# Patient Record
Sex: Male | Born: 1955
Health system: Southern US, Community
[De-identification: ages and names within clinical notes are randomized; demographics above are authoritative.]

## PROBLEM LIST (undated history)

## (undated) DIAGNOSIS — G4733 Obstructive sleep apnea (adult) (pediatric): Secondary | ICD-10-CM

## (undated) HISTORY — DX: Obstructive sleep apnea (adult) (pediatric): G47.33

---
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
## 2013-07-15 ENCOUNTER — Encounter: Payer: Self-pay | Admitting: Internal Medicine

## 2013-07-15 ENCOUNTER — Ambulatory Visit (INDEPENDENT_AMBULATORY_CARE_PROVIDER_SITE_OTHER): Payer: PRIVATE HEALTH INSURANCE | Admitting: Internal Medicine

## 2013-07-15 VITALS — BP 118/82 | HR 73 | Temp 97.5°F | Ht 74.0 in | Wt 269.4 lb

## 2013-07-15 DIAGNOSIS — E669 Obesity, unspecified: Secondary | ICD-10-CM

## 2013-07-15 DIAGNOSIS — Z1211 Encounter for screening for malignant neoplasm of colon: Secondary | ICD-10-CM

## 2013-07-15 DIAGNOSIS — Z0000 Encounter for general adult medical examination without abnormal findings: Secondary | ICD-10-CM

## 2013-07-15 DIAGNOSIS — G4733 Obstructive sleep apnea (adult) (pediatric): Secondary | ICD-10-CM | POA: Insufficient documentation

## 2013-07-15 MED ORDER — ZOLPIDEM TARTRATE 5 MG PO TABS: 5.0000 mg | ORAL_TABLET | Freq: Every evening | ORAL | Status: DC | PRN

## 2013-07-15 NOTE — Assessment & Plan Note (Signed)
Wt Readings from Last 3 Encounters:  07/15/13 269 lb 6.4 oz (122.199 kg)   Weight trends reviewed The patient is asked to make an attempt to improve diet and exercise patterns to aid in medical management of this problem.  Goal weight per patient 235-240lbs -encouragement provided

## 2013-07-15 NOTE — Progress Notes (Signed)
  Subjective:    Patient ID: Alex Rubio, male    DOB: 1956/11/12, 57 y.o.   MRN: [id number]  HPI  patient is here today for annual physical. Patient feels well and has no complaints.  Past Medical History  Diagnosis Date  . OSA (obstructive sleep apnea)     intol of CPAP trial (remote)   Family History  Problem Relation Age of Onset  . Leukemia Mother 67  . Diabetes Mellitus II Father    History  Substance Use Topics  . Smoking status: Never Smoker   . 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 Alcohol Use: No    Review of Systems Constitutional: Negative for fever or weight change.  Respiratory: Negative for cough and shortness of breath.   Cardiovascular: Negative for chest pain or palpitations.  Gastrointestinal: Negative for abdominal pain, no bowel changes.  Musculoskeletal: Negative for gait problem or joint swelling.  Skin: Negative for rash.  Neurological: Negative for dizziness or headache.  No other specific complaints in a complete review of systems (except as listed in HPI above).     Objective:   Physical Exam BP 118/82  Pulse 73  Temp(Src) 97.5 F (36.4 C) (Oral)  Ht 6\' 2"  (1.88 m)  Wt 269 lb 6.4 oz (122.199 kg)  BMI 34.57 kg/m2  SpO2 97% Wt Readings from Last 3 Encounters:  07/15/13 269 lb 6.4 oz (122.199 kg)   Constitutional: he appears well-developed and well-nourished. No distress.  HENT: Head: Normocephalic and atraumatic. Ears: B TMs ok, no erythema or effusion; Nose: Nose normal. Mouth/Throat: Oropharynx is clear and moist. No oropharyngeal exudate.  Eyes: Conjunctivae and EOM are normal. Pupils are equal, round, and reactive to light. No scleral icterus.  Neck: Normal range of motion. Neck supple. No JVD present. No thyromegaly present.  Cardiovascular: Normal rate, regular rhythm and normal heart sounds.  No murmur heard. No BLE edema. Pulmonary/Chest: Effort normal and breath sounds  normal. No respiratory distress. he has no wheezes.  Abdominal: Soft. Bowel sounds are normal. he exhibits no distension. There is no tenderness. no masses Musculoskeletal: Normal range of motion, no joint effusions. No gross deformities Neurological: he is alert and oriented to person, place, and time. No cranial nerve deficit. Coordination, balance, strength, speech and gait are normal.  Skin: Skin is warm and dry. No rash noted. No erythema.  Psychiatric: he has a normal mood and affect. behavior is normal. Judgment and thought content normal.  No results found for this basename: WBC,  HGB,  HCT,  PLT,  GLUCOSE,  CHOL,  TRIG,  HDL,  LDLDIRECT,  LDLCALC,  ALT,  AST,  NA,  K,  CL,  CREATININE,  BUN,  CO2,  TSH,  PSA,  INR,  GLUF,  HGBA1C,  MICROALBUR       Assessment & Plan:   CPX/v70.0 - Patient has been counseled on age-appropriate routine health concerns for screening and prevention. These are reviewed and up-to-date. Immunizations are up-to-date or declined. Labs ordered and reviewed.

## 2013-07-15 NOTE — Patient Instructions (Addendum)
It was good to see you today. We have reviewed your prior records including labs and tests today Health Maintenance reviewed - all recommended immunizations and age-appropriate screenings are up-to-date. Medications reviewed and updated, no changes recommended at this time. we'll make referral to sleep specialist for consultation about sleep apnea treatment options and to GI for colon screening (requested either Dr Leone Payor or Russella Dar). Our office will contact you regarding appointment(s) once made. Please schedule followup in 12 months for annual physical and labs, call sooner if problems.  Health Maintenance, Males A healthy lifestyle and preventative care can promote health and wellness.  Maintain regular health, dental, and eye exams.  Eat a healthy diet. Foods like vegetables, fruits, whole grains, low-fat dairy products, and lean protein foods contain the nutrients you need without too many calories. Decrease your intake of foods high in solid fats, added sugars, and salt. Get information about a proper diet from your caregiver, if necessary.  Regular physical exercise is one of the most important things you can do for your health. Most adults should get at least 150 minutes of moderate-intensity exercise (any activity that increases your heart rate and causes you to sweat) each week. In addition, most adults need muscle-strengthening exercises on 2 or more days a week.   Maintain a healthy weight. The body mass index (BMI) is a screening tool to identify possible weight problems. It provides an estimate of body fat based on height and weight. Your caregiver can help determine your BMI, and can help you achieve or maintain a healthy weight. For adults 20 years and older:  A BMI below 18.5 is considered underweight.  A BMI of 18.5 to 24.9 is normal.  A BMI of 25 to 29.9 is considered overweight.  A BMI of 30 and above is considered obese.  Maintain normal blood lipids and cholesterol by  exercising and minimizing your intake of saturated fat. Eat a balanced diet with plenty of fruits and vegetables. Blood tests for lipids and cholesterol should begin at age 44 and be repeated every 5 years. If your lipid or cholesterol levels are high, you are over 50, or you are a high risk for heart disease, you may need your cholesterol levels checked more frequently.Ongoing high lipid and cholesterol levels should be treated with medicines, if diet and exercise are not effective.  If you smoke, find out from your caregiver how to quit. If you do not use tobacco, do not start.  If you choose to drink alcohol, do not exceed 2 drinks per day. One drink is considered to be 12 ounces (355 mL) of beer, 5 ounces (148 mL) of wine, or 1.5 ounces (44 mL) of liquor.  Avoid use of street drugs. Do not share needles with anyone. Ask for help if you need support or instructions about stopping the use of drugs.  High blood pressure causes heart disease and increases the risk of stroke. Blood pressure should be checked at least every 1 to 2 years. Ongoing high blood pressure should be treated with medicines if weight loss and exercise are not effective.  If you are 56 to 56 years old, ask your caregiver if you should take aspirin to prevent heart disease.  Diabetes screening involves taking a blood sample to check your fasting blood sugar level. This should be done once every 3 years, after age 59, if you are within normal weight and without risk factors for diabetes. Testing should be considered at a younger age or be  carried out more frequently if you are overweight and have at least 1 risk factor for diabetes.  Colorectal cancer can be detected and often prevented. Most routine colorectal cancer screening begins at the age of 110 and continues through age 14. However, your caregiver may recommend screening at an earlier age if you have risk factors for colon cancer. On a yearly basis, your caregiver may provide  home test kits to check for hidden blood in the stool. Use of a small camera at the end of a tube, to directly examine the colon (sigmoidoscopy or colonoscopy), can detect the earliest forms of colorectal cancer. Talk to your caregiver about this at age 13, when routine screening begins. Direct examination of the colon should be repeated every 5 to 10 years through age 57, unless early forms of pre-cancerous polyps or small growths are found.  Hepatitis C blood testing is recommended for all people born from [birth year] through 1965 and any individual with known risks for hepatitis C.  Healthy men should no longer receive prostate-specific antigen (PSA) blood tests as part of routine cancer screening. Consult with your caregiver about prostate cancer screening.  Testicular cancer screening is not recommended for adolescents or adult males who have no symptoms. Screening includes self-exam, caregiver exam, and other screening tests. Consult with your caregiver about any symptoms you have or any concerns you have about testicular cancer.  Practice safe sex. Use condoms and avoid high-risk sexual practices to reduce the spread of sexually transmitted infections (STIs).  Use sunscreen with a sun protection factor (SPF) of 30 or greater. Apply sunscreen liberally and repeatedly throughout the day. You should seek shade when your shadow is shorter than you. Protect yourself by wearing long sleeves, pants, a wide-brimmed hat, and sunglasses year round, whenever you are outdoors.  Notify your caregiver of new moles or changes in moles, especially if there is a change in shape or color. Also notify your caregiver if a mole is larger than the size of a pencil eraser.  A one-time screening for abdominal aortic aneurysm (AAA) and surgical repair of large AAAs by sound wave imaging (ultrasonography) is recommended for ages 61 to 40 years who are current or former smokers.  Stay current with your immunizations. Document  Released: 06/09/2008 Document Revised: 03/05/2012 Document Reviewed: 05/09/2011 [facility] Patient Information 2014 [place], [place]. Exercise to Lose Weight Exercise and a healthy diet may help you lose weight. Your doctor may suggest specific exercises. EXERCISE IDEAS AND TIPS  Choose low-cost things you enjoy doing, such as walking, bicycling, or exercising to workout videos.  Take stairs instead of the elevator.  Walk during your lunch break.  Park your car further away from work or school.  Go to a gym or an exercise class.  Start with 5 to 10 minutes of exercise each day. Build up to 30 minutes of exercise 4 to 6 days a week.  Wear shoes with good support and comfortable clothes.  Stretch before and after working out.  Work out until you breathe harder and your heart beats faster.  Drink extra water when you exercise.  Do not do so much that you hurt yourself, feel dizzy, or get very short of breath. Exercises that burn about 150 calories:  Running 1  miles in 15 minutes.  Playing volleyball for 45 to 60 minutes.  Washing and waxing a car for 45 to 60 minutes.  Playing touch football for 45 minutes.  Walking 1  miles in 35 minutes.  Pushing a stroller 1  miles in 30 minutes.  Playing basketball for 30 minutes.  Raking leaves for 30 minutes.  Bicycling 5 miles in 30 minutes.  Walking 2 miles in 30 minutes.  Dancing for 30 minutes.  Shoveling snow for 15 minutes.  Swimming laps for 20 minutes.  Walking up stairs for 15 minutes.  Bicycling 4 miles in 15 minutes.  Gardening for 30 to 45 minutes.  Jumping rope for 15 minutes.  Washing windows or floors for 45 to 60 minutes. Document Released: 01/14/2011 Document Revised: 03/05/2012 Document Reviewed: 01/14/2011 [facility] Patient Information 2014 [place], [place].

## 2013-07-15 NOTE — Assessment & Plan Note (Signed)
Reports prior sleep study done approximately 2005, but intolerant of CPAP trial at the time. Weight gain reviewed, patient understands the relationship between weight and disease Patient agrees to followup sleep consultation regarding potential options for treatment of sleep apnea, we'll make referral now

## 2013-08-30 ENCOUNTER — Ambulatory Visit (INDEPENDENT_AMBULATORY_CARE_PROVIDER_SITE_OTHER): Payer: PRIVATE HEALTH INSURANCE | Admitting: Pulmonary Disease

## 2013-08-30 ENCOUNTER — Encounter: Payer: Self-pay | Admitting: Pulmonary Disease

## 2013-08-30 VITALS — BP 128/62 | HR 90 | Temp 98.2°F | Ht 74.0 in | Wt 262.0 lb

## 2013-08-30 DIAGNOSIS — G4733 Obstructive sleep apnea (adult) (pediatric): Secondary | ICD-10-CM

## 2013-08-30 NOTE — Progress Notes (Signed)
Chief Complaint  Patient presents with  . Sleep Consult    referred by Dr. Felicity Coyer for OSA. Epworth Score: 6.    History of Present Illness: Alex Rubio is a 57 y.o. male for evaluation of sleep problems.  He has a sleep study in 2007.  He was found to have sleep apnea.  He was tried on CPAP, but didn't like the mask.  He tried full face and nasal masks.  He was not sure if CPAP was helping him, so he stopped using.  He has gained about 20 lbs since 2007.  His wife is concerned that his snoring has gotten worse, and the his sleep apnea may be worse also.  He goes to sleep at 10 pm.  He falls asleep in 30 minutes to an hour.  He wakes up 2 to 3 times to use the bathroom.  He gets out of bed at 5 am.  He feels tired in the morning.  He denies morning headache.  He occasionally uses ambien to help him fall sleep.  He drinks 2 cups of coffee in the morning, and another cup in the afternoon.  He will occasionally fall asleep on the couch.  He denies sleep walking, sleep talking, bruxism, or nightmares.  There is no history of restless legs.  He denies sleep hallucinations, sleep paralysis, or cataplexy.  The Epworth score is 6 out of 24.  Tests:   Alex Rubio  has a past medical history of OSA (obstructive sleep apnea).  Alex Rubio  has past surgical history that includes Shoulder surgery (1977).  Prior to Admission medications   Medication Sig Start Date End Date Taking? Authorizing Provider  zolpidem (AMBIEN) 5 MG tablet Take 1 tablet (5 mg total) by mouth at bedtime as needed for sleep. 07/15/13 08/30/14 Yes Newt Lukes, MD    No Known Allergies  His family history includes Diabetes Mellitus II in his father; Leukemia (age of onset: 23) in his mother.  He  reports that he has never smoked. He does not have any smokeless tobacco history on file. He reports that he does not drink alcohol or use illicit drugs.  Review of Systems  Constitutional: Negative for fever, chills,  diaphoresis, activity change, appetite change, fatigue and unexpected weight change.  HENT: Negative for hearing loss, ear pain, nosebleeds, congestion, sore throat, facial swelling, rhinorrhea, sneezing, mouth sores, trouble swallowing, neck pain, neck stiffness, dental problem, voice change, postnasal drip, sinus pressure, tinnitus and ear discharge.   Eyes: Negative for photophobia, discharge, itching and visual disturbance.  Respiratory: Negative for apnea, cough, choking, chest tightness, shortness of breath, wheezing and stridor.   Cardiovascular: Negative for chest pain, palpitations and leg swelling.  Gastrointestinal: Negative for nausea, vomiting, abdominal pain, constipation, blood in stool and abdominal distention.  Genitourinary: Negative for dysuria, urgency, frequency, hematuria, flank pain, decreased urine volume and difficulty urinating.  Musculoskeletal: Negative for myalgias, back pain, joint swelling, arthralgias and gait problem.  Skin: Negative for color change, pallor and rash.  Neurological: Negative for dizziness, tremors, seizures, syncope, speech difficulty, weakness, light-headedness, numbness and headaches.  Hematological: Negative for adenopathy. Does not bruise/bleed easily.  Psychiatric/Behavioral: Positive for sleep disturbance. Negative for confusion and agitation. The patient is not nervous/anxious.    Physical Exam:  General - No distress ENT - No sinus tenderness, no oral exudate, MP 3, no LAN, no thyromegaly, TM clear, pupils equal/reactive Cardiac - s1s2 regular, no murmur, pulses symmetric Chest - No wheeze/rales/dullness, good air entry,  normal respiratory excursion Back - No focal tenderness Abd - Soft, non-tender, no organomegaly, + bowel sounds Ext - No edema Neuro - Normal strength, cranial nerves intact Skin - No rashes Psych - Normal mood, and behavior

## 2013-08-30 NOTE — Patient Instructions (Signed)
Will arrange for sleep study Will call to arrange for follow up after sleep study reviewed 

## 2013-08-30 NOTE — Progress Notes (Deleted)
  Subjective:    Patient ID: Alex Rubio, male    DOB: 23-Jun-1956, 57 y.o.   MRN: [id number]  HPI    Review of Systems  Constitutional: Negative for fever, chills, diaphoresis, activity change, appetite change, fatigue and unexpected weight change.  HENT: Negative for hearing loss, ear pain, nosebleeds, congestion, sore throat, facial swelling, rhinorrhea, sneezing, mouth sores, trouble swallowing, neck pain, neck stiffness, dental problem, voice change, postnasal drip, sinus pressure, tinnitus and ear discharge.   Eyes: Negative for photophobia, discharge, itching and visual disturbance.  Respiratory: Negative for apnea, cough, choking, chest tightness, shortness of breath, wheezing and stridor.   Cardiovascular: Negative for chest pain, palpitations and leg swelling.  Gastrointestinal: Negative for nausea, vomiting, abdominal pain, constipation, blood in stool and abdominal distention.  Genitourinary: Negative for dysuria, urgency, frequency, hematuria, flank pain, decreased urine volume and difficulty urinating.  Musculoskeletal: Negative for myalgias, back pain, joint swelling, arthralgias and gait problem.  Skin: Negative for color change, pallor and rash.  Neurological: Negative for dizziness, tremors, seizures, syncope, speech difficulty, weakness, light-headedness, numbness and headaches.  Hematological: Negative for adenopathy. Does not bruise/bleed easily.  Psychiatric/Behavioral: Positive for sleep disturbance. Negative for confusion and agitation. The patient is not nervous/anxious.        Objective:   Physical Exam        Assessment & Plan:

## 2013-09-02 NOTE — Assessment & Plan Note (Signed)
Has prior history of sleep apnea.  He reports continued snoring, sleep disruption, and daytime sleepiness.  He has gained weight since original sleep study.  I am concerned he still has sleep apnea.  We discussed how sleep apnea can affect various health problems including risks for hypertension, cardiovascular disease, and diabetes.  We also discussed how sleep disruption can increase risks for accident, such as while driving.  Weight loss as a means of improving sleep apnea was also reviewed.  Additional treatment options discussed were CPAP therapy, oral appliance, and surgical intervention.  Will arrange for in lab sleep study to further assess.

## 2013-09-04 ENCOUNTER — Other Ambulatory Visit (INDEPENDENT_AMBULATORY_CARE_PROVIDER_SITE_OTHER): Payer: PRIVATE HEALTH INSURANCE

## 2013-09-04 ENCOUNTER — Encounter: Payer: Self-pay | Admitting: Internal Medicine

## 2013-09-04 DIAGNOSIS — E785 Hyperlipidemia, unspecified: Secondary | ICD-10-CM | POA: Insufficient documentation

## 2013-09-04 DIAGNOSIS — Z0000 Encounter for general adult medical examination without abnormal findings: Secondary | ICD-10-CM

## 2013-09-04 DIAGNOSIS — E669 Obesity, unspecified: Secondary | ICD-10-CM

## 2013-09-04 LAB — CBC WITH DIFFERENTIAL/PLATELET
Basophils Absolute: 0 10*3/uL (ref 0.0–0.1)
Basophils Relative: 0.3 % (ref 0.0–3.0)
Eosinophils Absolute: 0.1 10*3/uL (ref 0.0–0.7)
Eosinophils Relative: 1.5 % (ref 0.0–5.0)
HCT: 41.4 % (ref 39.0–52.0)
Hemoglobin: 14.3 g/dL (ref 13.0–17.0)
Lymphocytes Relative: 29.1 % (ref 12.0–46.0)
Lymphs Abs: 1.9 10*3/uL (ref 0.7–4.0)
MCHC: 34.5 g/dL (ref 30.0–36.0)
MCV: 83.3 fl (ref 78.0–100.0)
Monocytes Absolute: 0.5 10*3/uL (ref 0.1–1.0)
Monocytes Relative: 7.2 % (ref 3.0–12.0)
Neutro Abs: 4.1 10*3/uL (ref 1.4–7.7)
Neutrophils Relative %: 61.9 % (ref 43.0–77.0)
Platelets: 234 10*3/uL (ref 150.0–400.0)
RBC: 4.97 Mil/uL (ref 4.22–5.81)
RDW: 14.2 % (ref 11.5–14.6)
WBC: 6.7 10*3/uL (ref 4.5–10.5)

## 2013-09-04 LAB — URINALYSIS, ROUTINE W REFLEX MICROSCOPIC
Bilirubin Urine: NEGATIVE
Hgb urine dipstick: NEGATIVE
Ketones, ur: NEGATIVE
Leukocytes, UA: NEGATIVE
Nitrite: NEGATIVE
RBC / HPF: NONE SEEN (ref 0–?)
Specific Gravity, Urine: 1.025 (ref 1.000–1.030)
Total Protein, Urine: NEGATIVE
Urine Glucose: NEGATIVE
Urobilinogen, UA: 0.2 (ref 0.0–1.0)
pH: 5.5 (ref 5.0–8.0)

## 2013-09-04 LAB — LIPID PANEL
Cholesterol: 223 mg/dL — ABNORMAL HIGH (ref 0–200)
HDL: 43.9 mg/dL (ref >39.00)
Total CHOL/HDL Ratio: 5
Triglycerides: 139 mg/dL (ref 0.0–149.0)
VLDL: 27.8 mg/dL (ref 0.0–40.0)

## 2013-09-04 LAB — BASIC METABOLIC PANEL
BUN: 13 mg/dL (ref 6–23)
CO2: 28 mEq/L (ref 19–32)
Calcium: 9.2 mg/dL (ref 8.4–10.5)
Chloride: 104 mEq/L (ref 96–112)
Creatinine, Ser: 0.9 mg/dL (ref 0.4–1.5)
GFR: 91.59 mL/min (ref >60.00)
Glucose, Bld: 84 mg/dL (ref 70–99)
Potassium: 4.6 mEq/L (ref 3.5–5.1)
Sodium: 137 mEq/L (ref 135–145)

## 2013-09-04 LAB — LDL CHOLESTEROL, DIRECT: Direct LDL: 144.4 mg/dL

## 2013-09-04 LAB — HEPATIC FUNCTION PANEL
ALT: 17 U/L (ref 0–53)
AST: 13 U/L (ref 0–37)
Albumin: 4.1 g/dL (ref 3.5–5.2)
Alkaline Phosphatase: 54 U/L (ref 39–117)
Bilirubin, Direct: 0.1 mg/dL (ref 0.0–0.3)
Total Bilirubin: 0.8 mg/dL (ref 0.3–1.2)
Total Protein: 7.1 g/dL (ref 6.0–8.3)

## 2013-09-04 LAB — PSA: PSA: 0.73 ng/mL (ref 0.10–4.00)

## 2013-09-04 LAB — TSH: TSH: 1.74 u[IU]/mL (ref 0.35–5.50)

## 2013-09-19 ENCOUNTER — Encounter: Payer: Self-pay | Admitting: Gastroenterology

## 2016-08-26 ENCOUNTER — Ambulatory Visit (INDEPENDENT_AMBULATORY_CARE_PROVIDER_SITE_OTHER): Payer: No Typology Code available for payment source

## 2016-08-26 ENCOUNTER — Ambulatory Visit (INDEPENDENT_AMBULATORY_CARE_PROVIDER_SITE_OTHER): Payer: No Typology Code available for payment source | Admitting: Physician Assistant

## 2016-08-26 VITALS — BP 122/80 | HR 67 | Temp 97.9°F | Resp 16 | Ht 74.0 in | Wt 262.0 lb

## 2016-08-26 DIAGNOSIS — R05 Cough: Secondary | ICD-10-CM

## 2016-08-26 DIAGNOSIS — R0981 Nasal congestion: Secondary | ICD-10-CM | POA: Diagnosis not present

## 2016-08-26 DIAGNOSIS — R059 Cough, unspecified: Secondary | ICD-10-CM

## 2016-08-26 LAB — POCT CBC
Granulocyte percent: 70.9 %G (ref 37–80)
HCT, POC: 37.8 % — AB (ref 43.5–53.7)
Hemoglobin: 13 g/dL — AB (ref 14.1–18.1)
Lymph, poc: 2.1 (ref 0.6–3.4)
MCH, POC: 28.6 pg (ref 27–31.2)
MCHC: 34.4 g/dL (ref 31.8–35.4)
MCV: 83.2 fL (ref 80–97)
MID (cbc): 0.8 (ref 0–0.9)
MPV: 7.5 fL (ref 0–99.8)
POC Granulocyte: 7 — AB (ref 2–6.9)
POC LYMPH PERCENT: 21.4 %L (ref 10–50)
POC MID %: 7.7 %M (ref 0–12)
Platelet Count, POC: 279 10*3/uL (ref 142–424)
RBC: 4.54 M/uL — AB (ref 4.69–6.13)
RDW, POC: 13.5 %
WBC: 9.9 10*3/uL (ref 4.6–10.2)

## 2016-08-26 MED ORDER — AZITHROMYCIN 500 MG PO TABS: 500.0000 mg | ORAL_TABLET | Freq: Every day | ORAL | 0 refills | Status: DC

## 2016-08-26 NOTE — Patient Instructions (Signed)
     IF you received an x-ray today, you will receive an invoice from [facility]. Please contact [facility] at [phone] with questions or concerns regarding your invoice.   IF you received labwork today, you will receive an invoice from [facility]. Please contact [facility] at [phone] with questions or concerns regarding your invoice.   Our billing staff will not be able to assist you with questions regarding bills from these companies.  You will be contacted with the lab results as soon as they are available. The fastest way to get your results is to activate your My Chart account. Instructions are located on the last page of this paperwork. If you have not heard from us regarding the results in 2 weeks, please contact this office.      

## 2016-08-26 NOTE — Progress Notes (Signed)
08/26/2016 10:57 AM   DOB: Jun 14, 1956 / MRN: [id number]  SUBJECTIVE:  Alex Rubio is a 60 y.o. male presenting for cough and nasal congestion that started about 8 days ago.  Felt he was improving at day five however suddenly became worse on day 7 of illness.  Denies fever, chills, nausea.  Has some teeth pain but only when coughing.  Never smoker, no history of asthma.   He has No Known Allergies.   He  has a past medical history of OSA (obstructive sleep apnea).    He  reports that he has never smoked. He does not have any smokeless tobacco history on file. He reports that he does not drink alcohol or use drugs. He  has no sexual activity history on file. The patient  has a past surgical history that includes Shoulder surgery (1977).  His family history includes Diabetes Mellitus II in his father; Leukemia (age of onset: 4168) in his mother.  Review of Systems  Constitutional: Negative for chills and fever.  HENT: Negative for ear pain.   Respiratory: Negative for hemoptysis, shortness of breath and wheezing.   Cardiovascular: Negative for chest pain and leg swelling.  Musculoskeletal: Negative for myalgias.  Skin: Negative for itching and rash.  Neurological: Negative for dizziness and headaches.    The problem list and medications were reviewed and updated by myself where necessary and exist elsewhere in the encounter.   OBJECTIVE:  BP 122/80   Pulse 67   Temp 97.9 F (36.6 C) (Oral)   Resp 16   Ht 6\' 2"  (1.88 m)   Wt 262 lb (118.8 kg)   SpO2 98%   BMI 33.64 kg/m   Physical Exam  Constitutional: He is oriented to person, place, and time. He appears well-developed and well-nourished. No distress.  HENT:  Right Ear: Tympanic membrane normal.  Left Ear: Tympanic membrane normal.  Nose: Nose normal.  Mouth/Throat: Uvula is midline, oropharynx is clear and moist and mucous membranes are normal.  Cardiovascular: Normal rate, regular rhythm, normal heart sounds and intact  distal pulses.   Pulmonary/Chest: Effort normal. No respiratory distress. He has no wheezes. He has rales (crackles at right lung base). He exhibits no tenderness.  Abdominal: Soft. Bowel sounds are normal. He exhibits no distension.  Neurological: He is alert and oriented to person, place, and time. He has normal reflexes. He displays normal reflexes. No cranial nerve deficit. He exhibits normal muscle tone. Coordination normal.  Skin: Skin is warm and dry. He is not diaphoretic.  Psychiatric: He has a normal mood and affect.    Results for orders placed or performed in visit on 08/26/16 (from the past 72 hour(s))  POCT CBC     Status: Abnormal   Collection Time: 08/26/16 10:42 AM  Result Value Ref Range   WBC 9.9 4.6 - 10.2 K/uL   Lymph, poc 2.1 0.6 - 3.4   POC LYMPH PERCENT 21.4 10 - 50 %L   MID (cbc) 0.8 0 - 0.9   POC MID % 7.7 0 - 12 %M   POC Granulocyte 7.0 (A) 2 - 6.9   Granulocyte percent 70.9 37 - 80 %G   RBC 4.54 (A) 4.69 - 6.13 M/uL   Hemoglobin 13.0 (A) 14.1 - 18.1 g/dL   HCT, POC 78.237.8 (A) 95.643.5 - 53.7 %   MCV 83.2 80 - 97 fL   MCH, POC 28.6 27 - 31.2 pg   MCHC 34.4 31.8 - 35.4 g/dL   RDW,  POC 13.5 %   Platelet Count, POC 279 142 - 424 K/uL   MPV 7.5 0 - 99.8 fL    Dg Chest 2 View  Result Date: 08/26/2016 CLINICAL DATA:  Cough and nasal congestion that started about 8 days ago. EXAM: CHEST  2 VIEW COMPARISON:  None. FINDINGS: The lungs are clear wiithout focal pneumonia, edema, pneumothorax or pleural effusion. The cardiopericardial silhouette is within normal limits for size. The visualized bony structures of the thorax are intact. IMPRESSION: No active cardiopulmonary disease. Electronically Signed   By: Kennith Center M.D.   On: 08/26/2016 10:54    ASSESSMENT AND PLAN  Alex Rubio was seen today for nasal congestion and cough.  Diagnoses and all orders for this visit:  Cough: He has crackles at the right lung base and CBC shows an early shift.  This may all be viral  however could easily be an early pneumonia.  Advised we cover for common organisms with Azithromycin.  -     DG Chest 2 View; Future -     POCT CBC  Nasal congestion: See problem one.     The patient is advised to call or return to clinic if he does not see an improvement in symptoms, or to seek the care of the closest emergency department if he worsens with the above plan.   Deliah Boston, MHS, PA-C [facility] 08/26/2016 10:57 AM

## 2016-10-11 ENCOUNTER — Ambulatory Visit (INDEPENDENT_AMBULATORY_CARE_PROVIDER_SITE_OTHER): Payer: No Typology Code available for payment source | Admitting: Internal Medicine

## 2016-10-11 ENCOUNTER — Encounter: Payer: Self-pay | Admitting: Internal Medicine

## 2016-10-11 VITALS — BP 118/84 | HR 73 | Temp 97.9°F | Resp 16 | Ht 74.0 in | Wt 260.0 lb

## 2016-10-11 DIAGNOSIS — E6609 Other obesity due to excess calories: Secondary | ICD-10-CM

## 2016-10-11 DIAGNOSIS — Z0000 Encounter for general adult medical examination without abnormal findings: Secondary | ICD-10-CM | POA: Diagnosis not present

## 2016-10-11 DIAGNOSIS — Z6833 Body mass index (BMI) 33.0-33.9, adult: Secondary | ICD-10-CM

## 2016-10-11 NOTE — Progress Notes (Signed)
Subjective:    Patient ID: Alex KiefGregory Rubio, male    DOB: [birth year], 60 y.o.   MRN: [id number]  HPI He is here for a physical exam.   He denies any changes in his health since he was here last.  He has no concerns or questions.    Medications and allergies reviewed with patient and updated if appropriate.  Patient Active Problem List   Diagnosis Date Noted  . Dyslipidemia   . Obese 07/15/2013  . OSA (obstructive sleep apnea)     No current outpatient prescriptions on file prior to visit.   No current facility-administered medications on file prior to visit.     Past Medical History:  Diagnosis Date  . OSA (obstructive sleep apnea)    intol of CPAP trial (remote)    Past Surgical History:  Procedure Laterality Date  . SHOULDER SURGERY  1977    Social History   Social History  . Marital status: Married    Spouse name: N/A  . Number of children: N/A  . Years of education: N/A   Social History Main Topics  . Smoking status: Never Smoker  . 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 Alcohol use No  . Drug use: No  . Sexual activity: Not Asked   Other Topics Concern  . None   Social History Narrative   Exercise: walking regularly    Family History  Problem Relation Age of Onset  . Leukemia Mother 6468  . Diabetes Mellitus II Father     Review of Systems  Constitutional: Negative for appetite change, chills, fatigue, fever and unexpected weight change.  HENT: Positive for hearing loss (mild in right).   Eyes: Negative for visual disturbance.  Respiratory: Negative for cough, shortness of breath and wheezing.   Cardiovascular: Negative for chest pain, palpitations and leg swelling.  Gastrointestinal: Negative for abdominal pain, blood in stool, constipation, diarrhea and nausea.       No gerd  Genitourinary: Negative for difficulty urinating, dysuria and hematuria.  Musculoskeletal: Positive for  arthralgias (right shoulder pain). Negative for back pain.  Skin: Negative for color change and rash.  Neurological: Negative for dizziness, light-headedness and headaches.  Psychiatric/Behavioral: Negative for dysphoric mood. The patient is not nervous/anxious.        Objective:   Vitals:   10/11/16 1421  BP: 118/84  Pulse: 73  Resp: 16  Temp: 97.9 F (36.6 C)   Filed Weights   10/11/16 1421  Weight: 260 lb (117.9 kg)   Body mass index is 33.38 kg/m.   Physical Exam Constitutional: He appears well-developed and well-nourished. No distress.  HENT:  Head: Normocephalic and atraumatic.  Right Ear: External ear normal.  Left Ear: External ear normal.  Mouth/Throat: Oropharynx is clear and moist.  Normal ear canals and TM b/l  Eyes: Conjunctivae and EOM are normal.  Neck: Neck supple. No tracheal deviation present. No thyromegaly present.  No carotid bruit  Cardiovascular: Normal rate, regular rhythm, normal heart sounds and intact distal pulses.   No murmur heard. Pulmonary/Chest: Effort normal and breath sounds normal. No respiratory distress. He has no wheezes. He has no rales.  Abdominal: Soft. Bowel sounds are normal. He exhibits no distension. There is no tenderness.  Genitourinary:  deferred by patient - will check psa Musculoskeletal: He exhibits no edema.  Lymphadenopathy:   He has no cervical adenopathy.  Skin: Skin is warm and dry. He  is not diaphoretic.  Psychiatric: He has a normal mood and affect. His behavior is normal.       Assessment & Plan:    Physical exam: Screening blood work ordered Immunizations  deferred Colonoscopy - deferred, but is willing to do cologuard - advised him to call his insurance co Eye exams - not up to date - advised him to schedule Exercise - regular Weight - working on weight loss Skin  - has had a skin exam , has seen derm Substance abuse - none  See Problem List for Assessment and Plan of chronic medical  problems.

## 2016-10-11 NOTE — Assessment & Plan Note (Signed)
Exercising Will work on weight loss

## 2016-10-11 NOTE — Patient Instructions (Addendum)
Call your insurance company and see if cologuard is covered.  If it is let us know.    Test(s) ordered today. Your results will be released to MyChart (or called to you) after review, usually within 72hours after test completion. If any changes need to be made, you will be notified at that same time.  All other Health Maintenance issues reviewed.   All recommended immunizations and age-appropriate screenings are up-to-date or discussed.  No immunizations administered today.   Medications reviewed and updated.  No changes recommended at this time.   Please followup in 1-2 years

## 2016-10-11 NOTE — Progress Notes (Signed)
Pre visit review using our clinic review tool, if applicable. No additional management support is needed unless otherwise documented below in the visit note. 

## 2017-02-09 ENCOUNTER — Other Ambulatory Visit: Payer: Self-pay | Admitting: Orthopedic Surgery

## 2017-02-09 DIAGNOSIS — M7581 Other shoulder lesions, right shoulder: Secondary | ICD-10-CM | POA: Diagnosis not present

## 2017-02-09 DIAGNOSIS — M25511 Pain in right shoulder: Secondary | ICD-10-CM

## 2017-02-13 DIAGNOSIS — R531 Weakness: Secondary | ICD-10-CM | POA: Diagnosis not present

## 2017-02-13 DIAGNOSIS — M25511 Pain in right shoulder: Secondary | ICD-10-CM | POA: Diagnosis not present

## 2017-02-13 DIAGNOSIS — M7501 Adhesive capsulitis of right shoulder: Secondary | ICD-10-CM | POA: Diagnosis not present

## 2017-02-13 DIAGNOSIS — M25611 Stiffness of right shoulder, not elsewhere classified: Secondary | ICD-10-CM | POA: Diagnosis not present

## 2017-02-18 ENCOUNTER — Ambulatory Visit
Admission: RE | Admit: 2017-02-18 | Discharge: 2017-02-18 | Disposition: A | Discharge status: Home / Self Care | Payer: No Typology Code available for payment source | Source: Ambulatory Visit | Attending: Orthopedic Surgery | Admitting: Orthopedic Surgery

## 2017-02-18 DIAGNOSIS — M25511 Pain in right shoulder: Secondary | ICD-10-CM

## 2017-03-31 DIAGNOSIS — M7501 Adhesive capsulitis of right shoulder: Secondary | ICD-10-CM | POA: Diagnosis not present

## 2017-03-31 DIAGNOSIS — M25511 Pain in right shoulder: Secondary | ICD-10-CM | POA: Diagnosis not present

## 2017-03-31 DIAGNOSIS — M6281 Muscle weakness (generalized): Secondary | ICD-10-CM | POA: Diagnosis not present

## 2017-03-31 DIAGNOSIS — M25611 Stiffness of right shoulder, not elsewhere classified: Secondary | ICD-10-CM | POA: Diagnosis not present

## 2017-04-28 DIAGNOSIS — M25511 Pain in right shoulder: Secondary | ICD-10-CM | POA: Diagnosis not present

## 2017-04-28 DIAGNOSIS — M7501 Adhesive capsulitis of right shoulder: Secondary | ICD-10-CM | POA: Diagnosis not present

## 2017-04-28 DIAGNOSIS — M6281 Muscle weakness (generalized): Secondary | ICD-10-CM | POA: Diagnosis not present

## 2017-04-28 DIAGNOSIS — M25611 Stiffness of right shoulder, not elsewhere classified: Secondary | ICD-10-CM | POA: Diagnosis not present

## 2017-05-19 DIAGNOSIS — M25611 Stiffness of right shoulder, not elsewhere classified: Secondary | ICD-10-CM | POA: Diagnosis not present

## 2017-05-19 DIAGNOSIS — M6281 Muscle weakness (generalized): Secondary | ICD-10-CM | POA: Diagnosis not present

## 2017-05-19 DIAGNOSIS — M7501 Adhesive capsulitis of right shoulder: Secondary | ICD-10-CM | POA: Diagnosis not present

## 2017-05-19 DIAGNOSIS — M25511 Pain in right shoulder: Secondary | ICD-10-CM | POA: Diagnosis not present

## 2017-11-14 ENCOUNTER — Encounter: Payer: Self-pay | Admitting: Internal Medicine

## 2017-11-14 ENCOUNTER — Ambulatory Visit (INDEPENDENT_AMBULATORY_CARE_PROVIDER_SITE_OTHER): Payer: BLUE CROSS/BLUE SHIELD | Admitting: Internal Medicine

## 2017-11-14 DIAGNOSIS — R05 Cough: Secondary | ICD-10-CM | POA: Diagnosis not present

## 2017-11-14 DIAGNOSIS — R059 Cough, unspecified: Secondary | ICD-10-CM | POA: Insufficient documentation

## 2017-11-14 MED ORDER — AZITHROMYCIN 250 MG PO TABS: ORAL_TABLET | ORAL | 0 refills | Status: DC

## 2017-11-14 NOTE — Assessment & Plan Note (Addendum)
Given time course will treat with antibiotics azithromycin. Informed about infectivity and need for hygiene and covering mouth when coughing to avoid passing germs.

## 2017-11-14 NOTE — Patient Instructions (Signed)
We have sent in the z-pack. Take 2 pills today, then 1 pill a day starting tomorrow.

## 2017-11-14 NOTE — Progress Notes (Signed)
   Subjective:    Patient ID: Alex KiefGregory Rubio, male    DOB: December 28, 1955, 61 y.o.   MRN: [id number]  HPI The patient is a 61 YO man coming in for cough and SOB for about 2 weeks. He is traveling for thanksgiving and does not want to expose older family members. Some fevers and chills in the last 3-4 days. Some nose drainage and headaches. Taking cold medicine during the day which helps some. He is able to sleep with relaxation. Coughing up clear to white sputum. Denies sore throat. Overall feels like it is mildly worse or not improved since 2 weeks ago.   Review of Systems  Constitutional: Positive for chills. Negative for activity change, appetite change, fatigue, fever and unexpected weight change.  HENT: Positive for congestion, postnasal drip, rhinorrhea and sinus pressure. Negative for ear discharge, ear pain, sinus pain, sneezing, sore throat, tinnitus, trouble swallowing and voice change.   Eyes: Negative.   Respiratory: Positive for cough and shortness of breath. Negative for chest tightness and wheezing.   Cardiovascular: Negative.   Gastrointestinal: Negative.   Musculoskeletal: Negative for myalgias.  Neurological: Positive for headaches.      Objective:   Physical Exam  Constitutional: He is oriented to person, place, and time. He appears well-developed and well-nourished.  HENT:  Head: Normocephalic and atraumatic.  Oropharynx with redness and clear drainage, nose with swollen turbinates, TMs normal bilaterally  Eyes: EOM are normal.  Neck: Normal range of motion. No thyromegaly present.  Shotty LAD cervical  Cardiovascular: Normal rate and regular rhythm.  Pulmonary/Chest: Effort normal and breath sounds normal. No respiratory distress. He has no wheezes. He has no rales.  Abdominal: Soft.  Musculoskeletal: He exhibits no tenderness.  Lymphadenopathy:    He has cervical adenopathy.  Neurological: He is alert and oriented to person, place, and time.  Skin: Skin is warm and  dry.   Vitals:   11/14/17 0824  BP: 122/84  Pulse: 90  Temp: 98.7 F (37.1 C)  TempSrc: Oral  SpO2: 98%  Weight: 265 lb (120.2 kg)  Height: 6\' 2"  (1.88 m)      Assessment & Plan:

## 2018-08-03 ENCOUNTER — Encounter: Payer: BLUE CROSS/BLUE SHIELD | Admitting: Internal Medicine

## 2018-08-30 NOTE — Progress Notes (Signed)
Subjective:    Patient ID: Alex Rubio, male    DOB: 28-Nov-1956, 62 y.o.   MRN: [id number]  HPI He is here for a physical exam.   He denies any changes since he was here last.  He has no concerns.  Medications and allergies reviewed with patient and updated if appropriate.  Patient Active Problem List   Diagnosis Date Noted  . Dyslipidemia   . Obese 07/15/2013  . OSA (obstructive sleep apnea)     No current outpatient medications on file prior to visit.   No current facility-administered medications on file prior to visit.     Past Medical History:  Diagnosis Date  . OSA (obstructive sleep apnea)    intol of CPAP trial (remote)    Past Surgical History:  Procedure Laterality Date  . SHOULDER SURGERY  1977    Social History   Socioeconomic History  . Marital status: Married    Spouse name: Not on file  . Number of children: Not on file  . Years of education: Not on file  . Highest education level: Not on file  Occupational History  . Not on file  Social Needs  . Financial resource strain: Not on file  . Food insecurity:    Worry: Not on file    Inability: Not on file  . Transportation needs:    Medical: Not on file    Non-medical: Not on file  Tobacco Use  . Smoking status: Never Smoker  . Smokeless tobacco: Never Used  . 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 Alcohol use: No  . Drug use: No  . Sexual activity: Not on file  Lifestyle  . Physical activity:    Days per week: Not on file    Minutes per session: Not on file  . Stress: Not on file  Relationships  . Social connections:    Talks on phone: Not on file    Gets together: Not on file    Attends religious service: Not on file    Active member of club or organization: Not on file    Attends meetings of clubs or organizations: Not on file    Relationship status: Not on file  Other Topics Concern  . Not on file  Social History  Narrative   Exercise: walking regularly    Family History  Problem Relation Age of Onset  . Leukemia Mother 22  . Diabetes Mellitus II Father     Review of Systems  Constitutional: Negative for chills and fever.  Eyes: Negative for visual disturbance.  Respiratory: Negative for cough, shortness of breath and wheezing.   Cardiovascular: Negative for chest pain, palpitations and leg swelling.  Gastrointestinal: Negative for abdominal pain, blood in stool, constipation, diarrhea and nausea.       No gerd  Genitourinary: Negative for difficulty urinating, dysuria and hematuria.  Musculoskeletal: Negative for arthralgias and back pain.  Skin: Negative for color change and rash.  Neurological: Negative for dizziness, light-headedness and headaches.  Psychiatric/Behavioral: Negative for dysphoric mood and sleep disturbance. The patient is not nervous/anxious.        Objective:   Vitals:   08/31/18 0811  BP: 132/84  Pulse: 68  Resp: 16  Temp: 98.4 F (36.9 C)  SpO2: 97%   Filed Weights   08/31/18 0811  Weight: 243 lb (110.2 kg)   Body mass index is 31.2 kg/m.  Wt Readings from Last 3  Encounters:  08/31/18 243 lb (110.2 kg)  11/14/17 265 lb (120.2 kg)  10/11/16 260 lb (117.9 kg)     Physical Exam Constitutional: He appears well-developed and well-nourished. No distress.  HENT:  Head: Normocephalic and atraumatic.  Right Ear: External ear normal.  Left Ear: External ear normal.  Mouth/Throat: Oropharynx is clear and moist.  Normal ear canals and TM b/l  Eyes: Conjunctivae and EOM are normal.  Neck: Neck supple. No tracheal deviation present. No thyromegaly present.  No carotid bruit  Cardiovascular: Normal rate, regular rhythm, normal heart sounds and intact distal pulses.   No murmur heard. Pulmonary/Chest: Effort normal and breath sounds normal. No respiratory distress. He has no wheezes. He has no rales.  Abdominal: Soft. He exhibits no distension. There is no  tenderness.  Genitourinary: deferred-we will just have PSA done Musculoskeletal: He exhibits no edema.  Lymphadenopathy:   He has no cervical adenopathy.  Skin: Skin is warm and dry. He is not diaphoretic.  Psychiatric: He has a normal mood and affect. His behavior is normal.         Assessment & Plan:   Physical exam: Screening blood work    ordered Immunizations   Discussed shingles, deferred flu, td Colonoscopy  - never had one -referral ordered Eye exams  - not up to date - advised to schedule   Exercise  Walking, elliptical intermittently Weight  Working on weight loss-encouraged weight loss, especially to improve cholesterol level Skin no concerns Substance abuse   none  See Problem List for Assessment and Plan of chronic medical problems.   Follow-up annually

## 2018-08-30 NOTE — Patient Instructions (Addendum)
Make an eye appointment.  Work on weight loss.  Consider the shingles vaccine.   Test(s) ordered today. Your results will be released to MyChart (or called to you) after review, usually within 72hours after test completion. If any changes need to be made, you will be notified at that same time.  All other Health Maintenance issues reviewed.   All recommended immunizations and age-appropriate screenings are up-to-date or discussed.  No immunizations administered today.   Medications reviewed and updated.  No changes recommended at this time.  A referral was ordered for Dr Loreta Ave.   Please followup in one year   Health Maintenance, Male A healthy lifestyle and preventive care is important for your health and wellness. Ask your health care provider about what schedule of regular examinations is right for you. What should I know about weight and diet? Eat a Healthy Diet  Eat plenty of vegetables, fruits, whole grains, low-fat dairy products, and lean protein.  Do not eat a lot of foods high in solid fats, added sugars, or salt.  Maintain a Healthy Weight Regular exercise can help you achieve or maintain a healthy weight. You should:  Do at least 150 minutes of exercise each week. The exercise should increase your heart rate and make you sweat (moderate-intensity exercise).  Do strength-training exercises at least twice a week.  Watch Your Levels of Cholesterol and Blood Lipids  Have your blood tested for lipids and cholesterol every 5 years starting at 61 years of age. If you are at high risk for heart disease, you should start having your blood tested when you are 61 years old. You may need to have your cholesterol levels checked more often if: ? Your lipid or cholesterol levels are high. ? You are older than 61 years of age. ? You are at high risk for heart disease.  What should I know about cancer screening? Many types of cancers can be detected early and may often be  prevented. Lung Cancer  You should be screened every year for lung cancer if: ? You are a current smoker who has smoked for at least 30 years. ? You are a former smoker who has quit within the past 15 years.  Talk to your health care provider about your screening options, when you should start screening, and how often you should be screened.  Colorectal Cancer  Routine colorectal cancer screening usually begins at 61 years of age and should be repeated every 5-10 years until you are 61 years old. You may need to be screened more often if early forms of precancerous polyps or small growths are found. Your health care provider may recommend screening at an earlier age if you have risk factors for colon cancer.  Your health care provider may recommend using home test kits to check for hidden blood in the stool.  A small camera at the end of a tube can be used to examine your colon (sigmoidoscopy or colonoscopy). This checks for the earliest forms of colorectal cancer.  Prostate and Testicular Cancer  Depending on your age and overall health, your health care provider may do certain tests to screen for prostate and testicular cancer.  Talk to your health care provider about any symptoms or concerns you have about testicular or prostate cancer.  Skin Cancer  Check your skin from head to toe regularly.  Tell your health care provider about any new moles or changes in moles, especially if: ? There is a change in a mole's  size, shape, or color. ? You have a mole that is larger than a pencil eraser.  Always use sunscreen. Apply sunscreen liberally and repeat throughout the day.  Protect yourself by wearing long sleeves, pants, a wide-brimmed hat, and sunglasses when outside.  What should I know about heart disease, diabetes, and high blood pressure?  If you are 94-31 years of age, have your blood pressure checked every 3-5 years. If you are 56 years of age or older, have your blood  pressure checked every year. You should have your blood pressure measured twice-once when you are at a hospital or clinic, and once when you are not at a hospital or clinic. Record the average of the two measurements. To check your blood pressure when you are not at a hospital or clinic, you can use: ? An automated blood pressure machine at a pharmacy. ? A home blood pressure monitor.  Talk to your health care provider about your target blood pressure.  If you are between 40-60 years old, ask your health care provider if you should take aspirin to prevent heart disease.  Have regular diabetes screenings by checking your fasting blood sugar level. ? If you are at a normal weight and have a low risk for diabetes, have this test once every three years after the age of 24. ? If you are overweight and have a high risk for diabetes, consider being tested at a younger age or more often.  A one-time screening for abdominal aortic aneurysm (AAA) by ultrasound is recommended for men aged 53-75 years who are current or former smokers. What should I know about preventing infection? Hepatitis B If you have a higher risk for hepatitis B, you should be screened for this virus. Talk with your health care provider to find out if you are at risk for hepatitis B infection. Hepatitis C Blood testing is recommended for:  Everyone born from [birth year] through 1964.  Anyone with known risk factors for hepatitis C.  Sexually Transmitted Diseases (STDs)  You should be screened each year for STDs including gonorrhea and chlamydia if: ? You are sexually active and are younger than 61 years of age. ? You are older than 61 years of age and your health care provider tells you that you are at risk for this type of infection. ? Your sexual activity has changed since you were last screened and you are at an increased risk for chlamydia or gonorrhea. Ask your health care provider if you are at risk.  Talk with your health  care provider about whether you are at high risk of being infected with HIV. Your health care provider may recommend a prescription medicine to help prevent HIV infection.  What else can I do?  Schedule regular health, dental, and eye exams.  Stay current with your vaccines (immunizations).  Do not use any tobacco products, such as cigarettes, chewing tobacco, and e-cigarettes. If you need help quitting, ask your health care provider.  Limit alcohol intake to no more than 2 drinks per day. One drink equals 12 ounces of beer, 5 ounces of wine, or 1 ounces of hard liquor.  Do not use street drugs.  Do not share needles.  Ask your health care provider for help if you need support or information about quitting drugs.  Tell your health care provider if you often feel depressed.  Tell your health care provider if you have ever been abused or do not feel safe at home. This information is not  intended to replace advice given to you by your health care provider. Make sure you discuss any questions you have with your health care provider. Document Released: 06/09/2008 Document Revised: 08/10/2016 Document Reviewed: 09/15/2015 Elsevier Interactive Patient Education  [employer].

## 2018-08-31 ENCOUNTER — Encounter: Payer: Self-pay | Admitting: Internal Medicine

## 2018-08-31 ENCOUNTER — Ambulatory Visit (INDEPENDENT_AMBULATORY_CARE_PROVIDER_SITE_OTHER): Payer: BLUE CROSS/BLUE SHIELD | Admitting: Internal Medicine

## 2018-08-31 ENCOUNTER — Encounter

## 2018-08-31 VITALS — BP 132/84 | HR 68 | Temp 98.4°F | Resp 16 | Ht 74.0 in | Wt 243.0 lb

## 2018-08-31 DIAGNOSIS — Z1159 Encounter for screening for other viral diseases: Secondary | ICD-10-CM

## 2018-08-31 DIAGNOSIS — Z1211 Encounter for screening for malignant neoplasm of colon: Secondary | ICD-10-CM

## 2018-08-31 DIAGNOSIS — Z0000 Encounter for general adult medical examination without abnormal findings: Secondary | ICD-10-CM

## 2018-08-31 DIAGNOSIS — E785 Hyperlipidemia, unspecified: Secondary | ICD-10-CM

## 2018-08-31 DIAGNOSIS — Z114 Encounter for screening for human immunodeficiency virus [HIV]: Secondary | ICD-10-CM

## 2018-08-31 DIAGNOSIS — E6609 Other obesity due to excess calories: Secondary | ICD-10-CM

## 2018-08-31 DIAGNOSIS — Z6833 Body mass index (BMI) 33.0-33.9, adult: Secondary | ICD-10-CM

## 2018-08-31 NOTE — Assessment & Plan Note (Signed)
Encourage weight loss-goal weight in the 230s Continue regular exercise Improve diet and decrease portions Follow-up in 1 year, sooner if needed

## 2018-08-31 NOTE — Assessment & Plan Note (Signed)
Cholesterol has been slightly elevated in the past Stressed the importance of continuing regular exercise, improving diet and working on weight loss Check lipid panel, CMP, TSH

## 2018-09-03 ENCOUNTER — Other Ambulatory Visit (INDEPENDENT_AMBULATORY_CARE_PROVIDER_SITE_OTHER): Payer: BLUE CROSS/BLUE SHIELD

## 2018-09-03 DIAGNOSIS — E785 Hyperlipidemia, unspecified: Secondary | ICD-10-CM

## 2018-09-03 DIAGNOSIS — Z1159 Encounter for screening for other viral diseases: Secondary | ICD-10-CM

## 2018-09-03 DIAGNOSIS — Z0000 Encounter for general adult medical examination without abnormal findings: Secondary | ICD-10-CM | POA: Diagnosis not present

## 2018-09-03 DIAGNOSIS — Z114 Encounter for screening for human immunodeficiency virus [HIV]: Secondary | ICD-10-CM | POA: Diagnosis not present

## 2018-09-03 LAB — CBC WITH DIFFERENTIAL/PLATELET
Basophils Absolute: 0 10*3/uL (ref 0.0–0.1)
Basophils Relative: 0.4 % (ref 0.0–3.0)
Eosinophils Absolute: 0.1 10*3/uL (ref 0.0–0.7)
Eosinophils Relative: 1.5 % (ref 0.0–5.0)
HCT: 41.7 % (ref 39.0–52.0)
Hemoglobin: 14.1 g/dL (ref 13.0–17.0)
Lymphocytes Relative: 36.1 % (ref 12.0–46.0)
Lymphs Abs: 2 10*3/uL (ref 0.7–4.0)
MCHC: 33.7 g/dL (ref 30.0–36.0)
MCV: 83.3 fl (ref 78.0–100.0)
Monocytes Absolute: 0.4 10*3/uL (ref 0.1–1.0)
Monocytes Relative: 7.2 % (ref 3.0–12.0)
Neutro Abs: 3 10*3/uL (ref 1.4–7.7)
Neutrophils Relative %: 54.8 % (ref 43.0–77.0)
Platelets: 237 10*3/uL (ref 150.0–400.0)
RBC: 5.01 Mil/uL (ref 4.22–5.81)
RDW: 14.1 % (ref 11.5–15.5)
WBC: 5.5 10*3/uL (ref 4.0–10.5)

## 2018-09-03 LAB — COMPREHENSIVE METABOLIC PANEL
ALT: 21 U/L (ref 0–53)
AST: 15 U/L (ref 0–37)
Albumin: 4.1 g/dL (ref 3.5–5.2)
Alkaline Phosphatase: 58 U/L (ref 39–117)
BUN: 13 mg/dL (ref 6–23)
CO2: 27 mEq/L (ref 19–32)
Calcium: 9.3 mg/dL (ref 8.4–10.5)
Chloride: 105 mEq/L (ref 96–112)
Creatinine, Ser: 1.02 mg/dL (ref 0.40–1.50)
GFR: 78.91 mL/min (ref >60.00)
Glucose, Bld: 90 mg/dL (ref 70–99)
Potassium: 3.9 mEq/L (ref 3.5–5.1)
Sodium: 139 mEq/L (ref 135–145)
Total Bilirubin: 0.7 mg/dL (ref 0.2–1.2)
Total Protein: 7.1 g/dL (ref 6.0–8.3)

## 2018-09-03 LAB — LIPID PANEL
Cholesterol: 216 mg/dL — ABNORMAL HIGH (ref 0–200)
HDL: 56.7 mg/dL (ref >39.00)
LDL Cholesterol: 144 mg/dL — ABNORMAL HIGH (ref 0–99)
NonHDL: 159.61
Total CHOL/HDL Ratio: 4
Triglycerides: 77 mg/dL (ref 0.0–149.0)
VLDL: 15.4 mg/dL (ref 0.0–40.0)

## 2018-09-03 LAB — TSH: TSH: 2.16 u[IU]/mL (ref 0.35–4.50)

## 2018-09-04 LAB — HEPATITIS C ANTIBODY
Hepatitis C Ab: NONREACTIVE
SIGNAL TO CUT-OFF: 0.03 (ref <1.00)

## 2018-09-04 LAB — PSA, TOTAL AND FREE
PSA, % Free: 20 % (calc) — ABNORMAL LOW (ref >25)
PSA, Free: 0.2 ng/mL
PSA, Total: 1 ng/mL (ref <=4.0)

## 2018-09-04 LAB — HIV ANTIBODY (ROUTINE TESTING W REFLEX): HIV 1&2 Ab, 4th Generation: NONREACTIVE

## 2018-09-21 IMAGING — MR MR SHOULDER*R* W/O CM
4 of 5 series · 26 of 40 positions shown · non-contrast
Comparison: None.

CLINICAL DATA: Right shoulder pain and limited range of motion. The
patient feels as if the right shoulder is locked. History of right
shoulder surgery in 8399. No recent injury.

EXAM:
MRI OF THE RIGHT SHOULDER WITHOUT CONTRAST
TECHNIQUE: Multiplanar, multisequence MR imaging of the shoulder was performed.
No intravenous contrast was administered.

[Series 3: T2 fat-sat · axial · 4.0mm · 0.62mm/px · z∈[-34,+63]mm · 8 of 22 slices shown (1 of 3)]
[im 1/22]
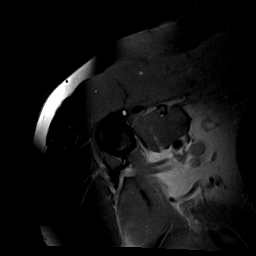
[im 4/22]
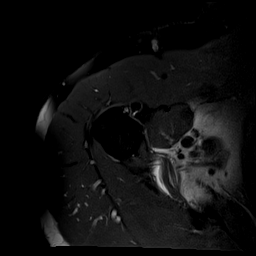
[im 7/22]
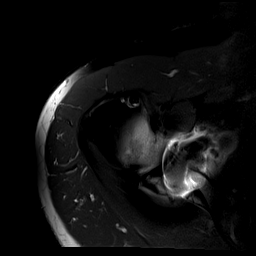
[im 10/22]
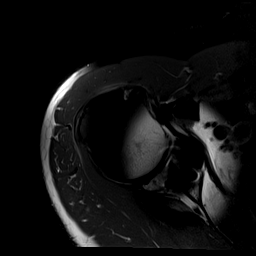
[im 13/22]
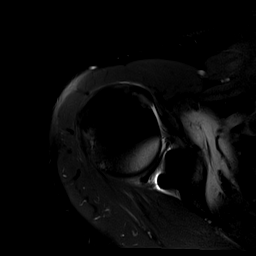
[im 16/22]
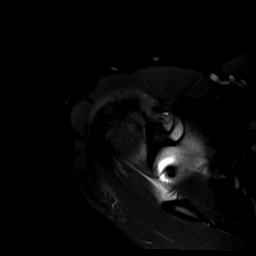
[im 19/22]
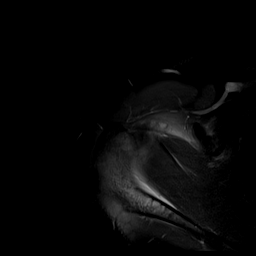
[im 22/22]
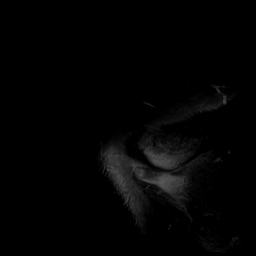

[Series 5: T2 fat-sat · oblique · 4.0mm · 0.62mm/px · 7 of 24 slices shown (2 of 3)]
[im 1/24]
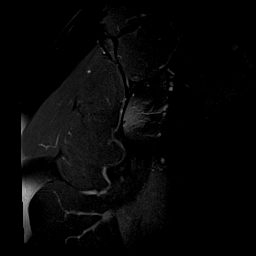
[im 4/24]
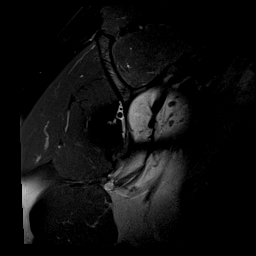
[im 7/24]
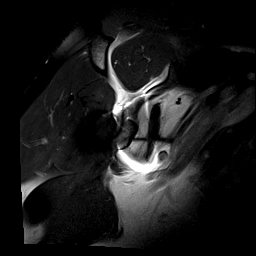
[im 10/24]
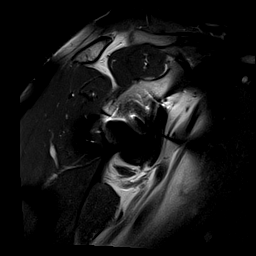
[im 14/24]
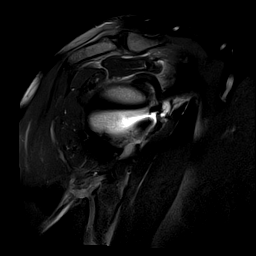
[im 17/24]
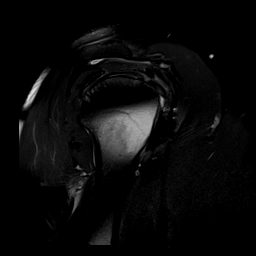
[im 20/24]
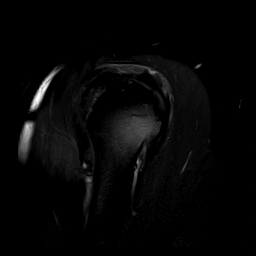

[Series 7: T2 fat-sat · oblique · 4.0mm · 0.31mm/px · 3 of 22 slices shown (3 of 3)]
[im 4/22]
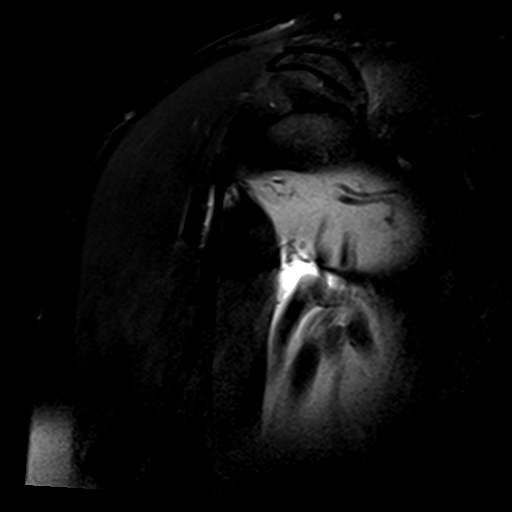
[im 13/22]
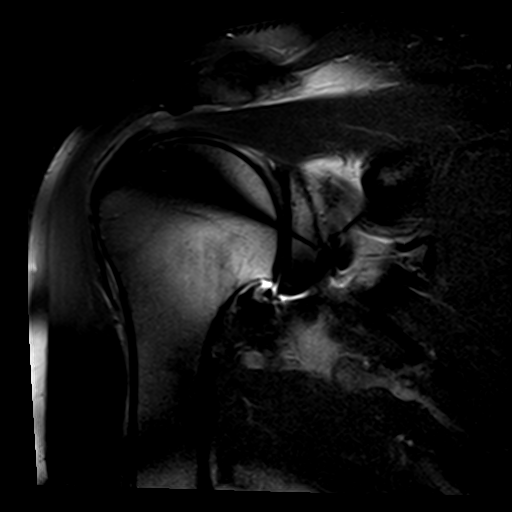
[im 19/22]
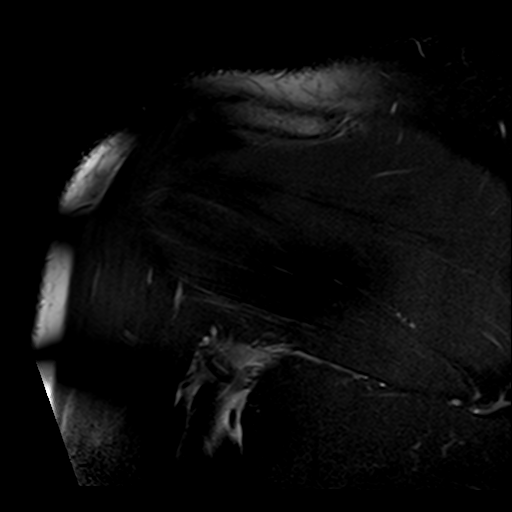

[Series 8: PD · oblique · 4.0mm · 0.25mm/px · 8 of 22 slices shown]
[im 1/22]
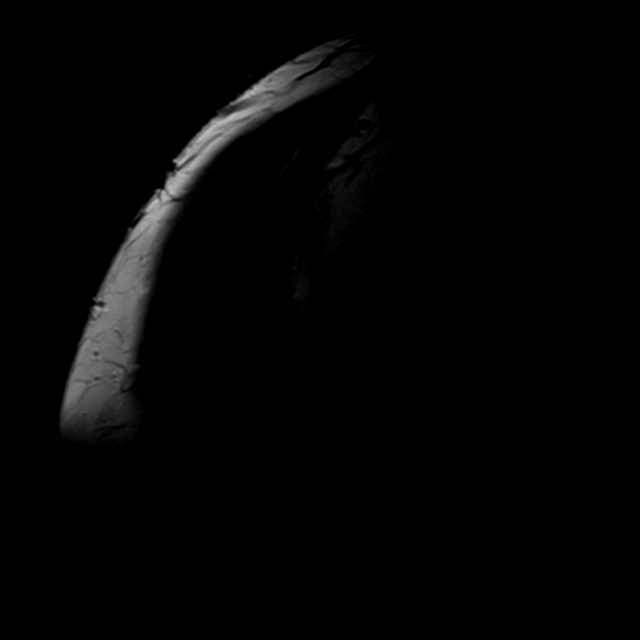
[im 4/22]
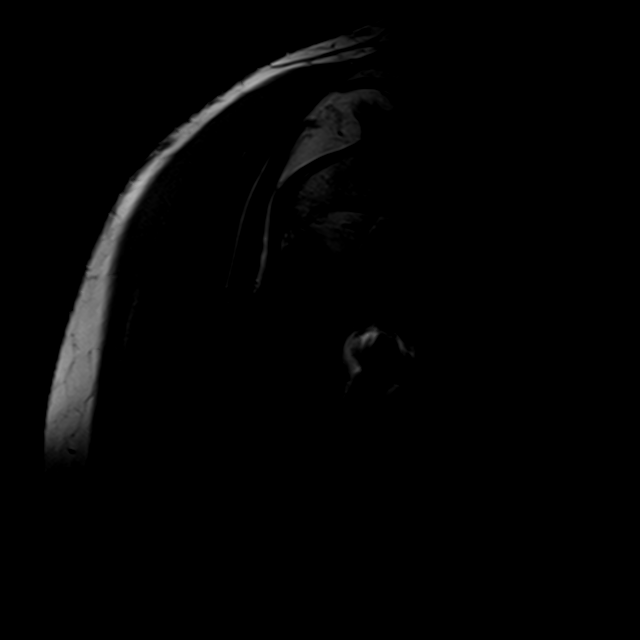
[im 7/22]
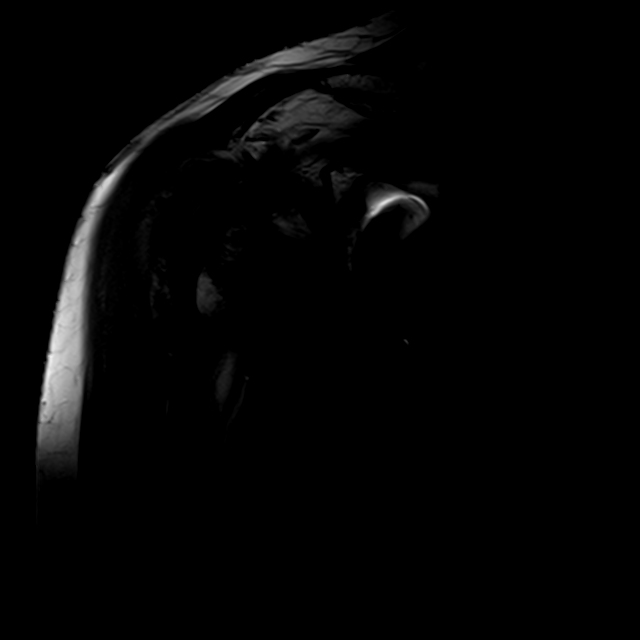
[im 10/22]
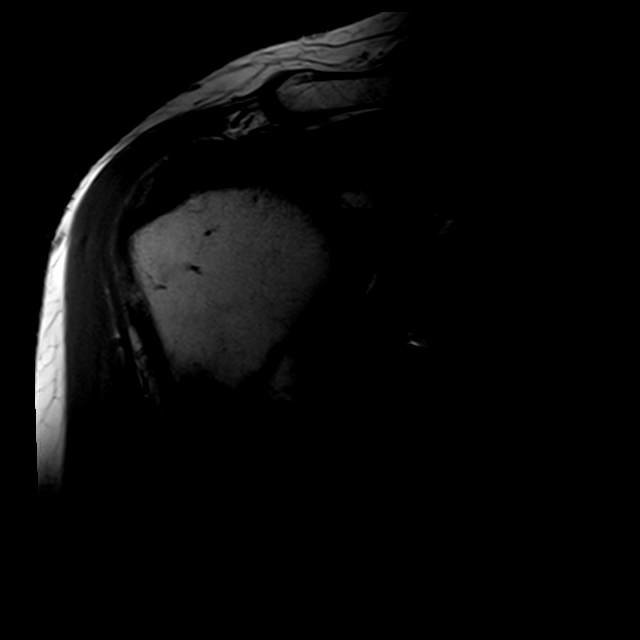
[im 13/22]
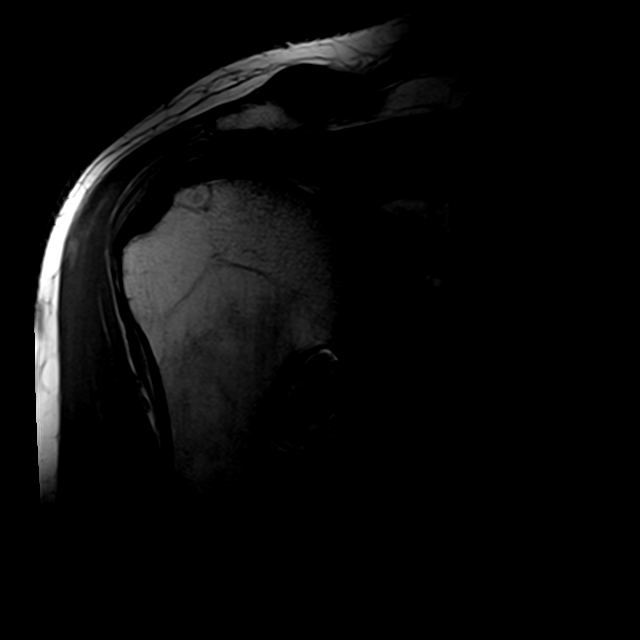
[im 16/22]
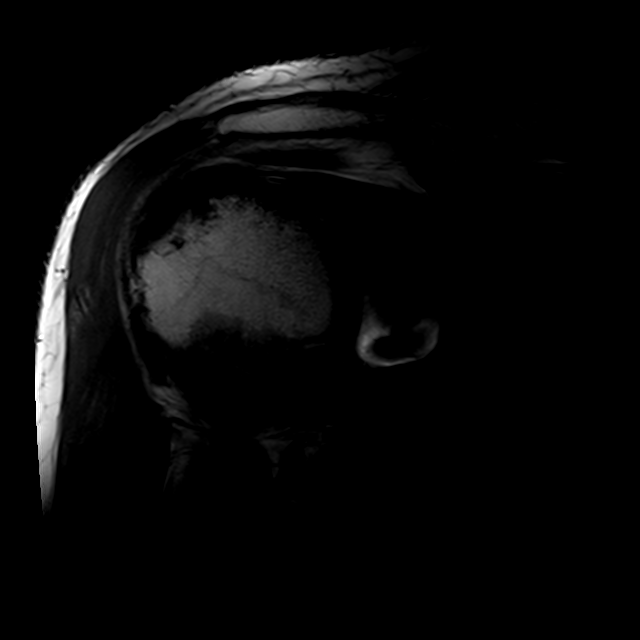
[im 19/22]
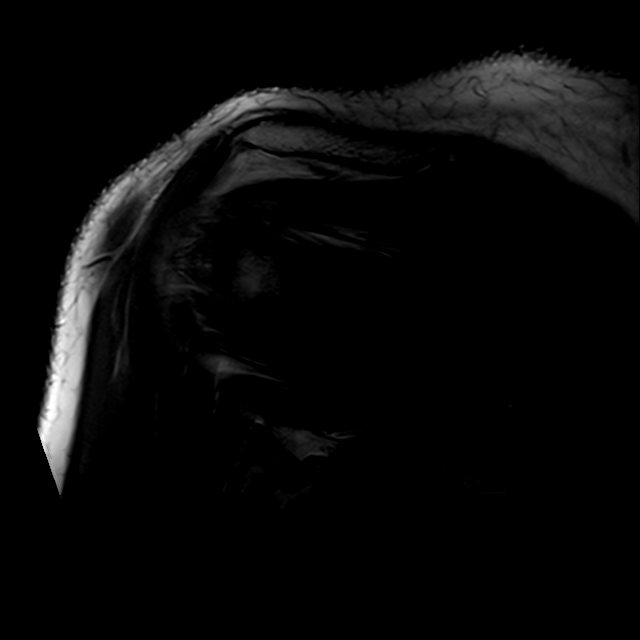
[im 22/22]
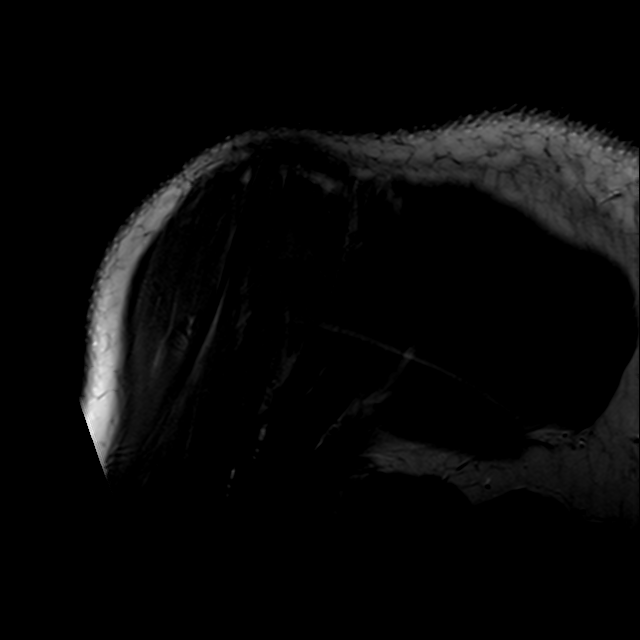

[26 of 40 positions shown; findings below may reference images not displayed]

FINDINGS: Artifact from plate and screws in the medial aspect of proximal
humerus somewhat limits the exam. Artifact reduction techniques were
employed on the study.

Rotator cuff: Mild to moderate rotator cuff tendinopathy appears
worst in the supraspinatus. No tear is identified.

Muscles: There is some fatty atrophy of the subscapularis muscle
belly although although no subscapularis tear is identified.
Musculature otherwise appears normal.

Biceps long head: Intrasubstance increased T2 signal is seen in the
intra-articular segment of the tendon consistent with tendinopathy
without tear.

Acromioclavicular Joint: Mild degenerative disease is identified.
Type 1 acromion. No subacromial/subdeltoid bursal fluid.

Glenohumeral Joint: Partially obscured by artifact.  Appears normal.

Labrum:  Partially obscured.  No tear identified.

Bones:  No fracture or worrisome lesion.

Other: None.
IMPRESSION: Rotator cuff tendinopathy. There is atrophy of the subscapularis
muscle belly although no tendon tear is seen.

Tendinopathy of the intra-articular long head of biceps without
tear.

Mild acromioclavicular osteoarthritis.

## 2018-09-27 DIAGNOSIS — Z1211 Encounter for screening for malignant neoplasm of colon: Secondary | ICD-10-CM | POA: Diagnosis not present

## 2018-09-27 DIAGNOSIS — E669 Obesity, unspecified: Secondary | ICD-10-CM | POA: Diagnosis not present

## 2018-12-20 DIAGNOSIS — Z1212 Encounter for screening for malignant neoplasm of rectum: Secondary | ICD-10-CM | POA: Diagnosis not present

## 2018-12-20 DIAGNOSIS — Z1211 Encounter for screening for malignant neoplasm of colon: Secondary | ICD-10-CM | POA: Diagnosis not present

## 2019-10-15 DIAGNOSIS — S83241A Other tear of medial meniscus, current injury, right knee, initial encounter: Secondary | ICD-10-CM | POA: Diagnosis not present

## 2019-11-16 DIAGNOSIS — M25561 Pain in right knee: Secondary | ICD-10-CM | POA: Diagnosis not present

## 2019-11-19 DIAGNOSIS — S83241A Other tear of medial meniscus, current injury, right knee, initial encounter: Secondary | ICD-10-CM | POA: Diagnosis not present

## 2019-11-26 DIAGNOSIS — S83241D Other tear of medial meniscus, current injury, right knee, subsequent encounter: Secondary | ICD-10-CM | POA: Diagnosis not present

## 2019-12-05 DIAGNOSIS — S83241D Other tear of medial meniscus, current injury, right knee, subsequent encounter: Secondary | ICD-10-CM | POA: Diagnosis not present

## 2020-01-06 DIAGNOSIS — S83241D Other tear of medial meniscus, current injury, right knee, subsequent encounter: Secondary | ICD-10-CM | POA: Diagnosis not present

## 2020-11-30 ENCOUNTER — Other Ambulatory Visit: Payer: Self-pay

## 2020-12-01 ENCOUNTER — Encounter: Payer: Self-pay | Admitting: Internal Medicine

## 2020-12-01 ENCOUNTER — Ambulatory Visit (INDEPENDENT_AMBULATORY_CARE_PROVIDER_SITE_OTHER): Payer: BC Managed Care – PPO | Admitting: Internal Medicine

## 2020-12-01 VITALS — BP 128/70 | HR 70 | Temp 98.1°F | Ht 74.0 in | Wt 258.0 lb

## 2020-12-01 DIAGNOSIS — Z6833 Body mass index (BMI) 33.0-33.9, adult: Secondary | ICD-10-CM

## 2020-12-01 DIAGNOSIS — E785 Hyperlipidemia, unspecified: Secondary | ICD-10-CM | POA: Diagnosis not present

## 2020-12-01 DIAGNOSIS — E66811 Obesity, class 1: Secondary | ICD-10-CM

## 2020-12-01 DIAGNOSIS — G479 Sleep disorder, unspecified: Secondary | ICD-10-CM

## 2020-12-01 DIAGNOSIS — E6609 Other obesity due to excess calories: Secondary | ICD-10-CM

## 2020-12-01 DIAGNOSIS — Z0000 Encounter for general adult medical examination without abnormal findings: Secondary | ICD-10-CM

## 2020-12-01 DIAGNOSIS — Z833 Family history of diabetes mellitus: Secondary | ICD-10-CM | POA: Diagnosis not present

## 2020-12-01 MED ORDER — ZOLPIDEM TARTRATE 5 MG PO TABS: 5.0000 mg | ORAL_TABLET | Freq: Every evening | ORAL | 1 refills | Status: DC | PRN

## 2020-12-01 NOTE — Assessment & Plan Note (Signed)
Chronic Cholesterol higher than ideal He will restart regular exercise and work on weight loss once able Advised low-fat/cholesterol diet Check lipids, CMP when he is fasting Discussed possibly starting a statin-may decide to recheck in a few months after doing lifestyle changes Discussed considering CT coronary calcium score

## 2020-12-01 NOTE — Assessment & Plan Note (Signed)
Chronic Check A1c 

## 2020-12-01 NOTE — Progress Notes (Signed)
Subjective:    Patient ID: Alex Rubio, male    DOB: 1956/01/09, 64 y.o.   MRN: [id number]  HPI He is here for a physical exam.   He gets up too early - 4 am and at one point took [place].  He does feel tired because he is not getting as much sleep as he needs.  He would be interested in taking Ambien again-he only took it as needed and not often.  He does not want to take anything on a regular basis.  He was exercising regularly and had lost some weight, but injured his knee and has not been able to get back to regular exercise.  He does plan on doing exercise once able.  Medications and allergies reviewed with patient and updated if appropriate.  Patient Active Problem List   Diagnosis Date Noted  . Family history of diabetes mellitus in father 12/01/2020  . Dyslipidemia   . Obese 07/15/2013  . OSA (obstructive sleep apnea)     No current outpatient medications on file prior to visit.   No current facility-administered medications on file prior to visit.    Past Medical History:  Diagnosis Date  . OSA (obstructive sleep apnea)    intol of CPAP trial (remote)    Past Surgical History:  Procedure Laterality Date  . SHOULDER SURGERY  1977    Social History   Socioeconomic History  . Marital status: Married    Spouse name: Not on file  . Number of children: Not on file  . Years of education: Not on file  . Highest education level: Not on file  Occupational History  . Not on file  Tobacco Use  . Smoking status: Never Smoker  . Smokeless tobacco: Never Used  . 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 Alcohol use: No  . Drug use: No  . Sexual activity: Not on file  Other Topics Concern  . Not on file  Social History Narrative   Exercise: walking regularly   Social Determinants of Health   Financial Resource Strain:   . Difficulty of Paying Living Expenses: Not on file  Food Insecurity:   .  Worried About Programme researcher, broadcasting/film/video in the Last Year: Not on file  . Ran Out of Food in the Last Year: Not on file  Transportation Needs:   . Lack of Transportation (Medical): Not on file  . Lack of Transportation (Non-Medical): Not on file  Physical Activity:   . Days of Exercise per Week: Not on file  . Minutes of Exercise per Session: Not on file  Stress:   . Feeling of Stress : Not on file  Social Connections:   . Frequency of Communication with Friends and Family: Not on file  . Frequency of Social Gatherings with Friends and Family: Not on file  . Attends Religious Services: Not on file  . Active Member of Clubs or Organizations: Not on file  . Attends Banker Meetings: Not on file  . Marital Status: Not on file    Family History  Problem Relation Age of Onset  . Leukemia Mother 65  . Diabetes Mellitus II Father     Review of Systems  Constitutional: Negative for chills and fever.  Eyes: Negative for visual disturbance.  Respiratory: Negative for cough, shortness of breath and wheezing.   Cardiovascular: Negative for chest pain, palpitations and leg swelling.  Gastrointestinal: Negative for abdominal  pain, blood in stool, constipation, diarrhea and nausea.       No gerd  Genitourinary: Negative for difficulty urinating, dysuria and hematuria.  Musculoskeletal: Negative for back pain.       Knee   Skin: Negative for color change and rash.  Neurological: Negative for dizziness, light-headedness and headaches.  Psychiatric/Behavioral: Negative for dysphoric mood. The patient is not nervous/anxious.        Objective:   Vitals:   12/01/20 1340  BP: 128/70  Pulse: 70  Temp: 98.1 F (36.7 C)  SpO2: 98%   Filed Weights   12/01/20 1340  Weight: 258 lb (117 kg)   Body mass index is 33.13 kg/m.  BP Readings from Last 3 Encounters:  12/01/20 128/70  08/31/18 132/84  11/14/17 122/84    Wt Readings from Last 3 Encounters:  12/01/20 258 lb (117 kg)    08/31/18 243 lb (110.2 kg)  11/14/17 265 lb (120.2 kg)     Physical Exam Constitutional: He appears well-developed and well-nourished. No distress.  HENT:  Head: Normocephalic and atraumatic.  Right Ear: External ear normal.  Left Ear: External ear normal.  Mouth/Throat: Oropharynx is clear and moist.  Normal ear canals and TM b/l  Eyes: Conjunctivae and EOM are normal.  Neck: Neck supple. No tracheal deviation present. No thyromegaly present.  No carotid bruit  Cardiovascular: Normal rate, regular rhythm, normal heart sounds and intact distal pulses.   No murmur heard. Pulmonary/Chest: Effort normal and breath sounds normal. No respiratory distress. He has no wheezes. He has no rales.  Abdominal: Soft. He exhibits no distension. There is no tenderness.  Genitourinary: deferred  Musculoskeletal: He exhibits no edema.  Lymphadenopathy:   He has no cervical adenopathy.  Skin: Skin is warm and dry. He is not diaphoretic.  Psychiatric: He has a normal mood and affect. His behavior is normal.    The 10-year ASCVD risk score Denman George DC Montez Hageman., et al., 2013) is: 10.7%   Values used to calculate the score:     Age: 20 years     Sex: Male     Is Non-Hispanic African American: No     Diabetic: No     Tobacco smoker: No     Systolic Blood Pressure: 128 mmHg     Is BP treated: No     HDL Cholesterol: 56.7 mg/dL     Total Cholesterol: 216 mg/dL      Assessment & Plan:   Health Maintenance  Topic Date Due  . TETANUS/TDAP  Never done  . Fecal DNA (Cologuard)  Never done  . INFLUENZA VACCINE  07/26/2020  . COVID-19 Vaccine  Completed  . Hepatitis C Screening  Completed  . HIV Screening  Completed     Physical exam: Screening blood work  ordered Immunizations  Discussed tdap, shingrix, flu vaccine Cologuard -   Up to date - via Dr [address] [address] [facility] exams   Not up to date Exercise   Discussed importance of regular exercise Weight  Encouraged weight loss Substance abuse   none  See  Problem List for Assessment and Plan of chronic medical problems.   This visit occurred during the SARS-CoV-2 public health emergency.  Safety protocols were in place, including screening questions prior to the visit, additional usage of staff PPE, and extensive cleaning of exam room while observing appropriate contact time as indicated for disinfecting solutions.

## 2020-12-01 NOTE — Patient Instructions (Addendum)
Have an eye exam and get the shingles.    Blood work was ordered.     No immunization administered today.   Medications changes include :   ambien 5 mg at nighttime.    Your prescription(s) have been submitted to your pharmacy.      Please followup in 1 year    Health Maintenance, Male Adopting a healthy lifestyle and getting preventive care are important in promoting health and wellness. Ask your health care provider about:  The right schedule for you to have regular tests and exams.  Things you can do on your own to prevent diseases and keep yourself healthy. What should I know about diet, weight, and exercise? Eat a healthy diet   Eat a diet that includes plenty of vegetables, fruits, low-fat dairy products, and lean protein.  Do not eat a lot of foods that are high in solid fats, added sugars, or sodium. Maintain a healthy weight Body mass index (BMI) is a measurement that can be used to identify possible weight problems. It estimates body fat based on height and weight. Your health care provider can help determine your BMI and help you achieve or maintain a healthy weight. Get regular exercise Get regular exercise. This is one of the most important things you can do for your health. Most adults should:  Exercise for at least 150 minutes each week. The exercise should increase your heart rate and make you sweat (moderate-intensity exercise).  Do strengthening exercises at least twice a week. This is in addition to the moderate-intensity exercise.  Spend less time sitting. Even light physical activity can be beneficial. Watch cholesterol and blood lipids Have your blood tested for lipids and cholesterol at 63 years of age, then have this test every 5 years. You may need to have your cholesterol levels checked more often if:  Your lipid or cholesterol levels are high.  You are older than 63 years of age.  You are at high risk for heart disease. What should I know  about cancer screening? Many types of cancers can be detected early and may often be prevented. Depending on your health history and family history, you may need to have cancer screening at various ages. This may include screening for:  Colorectal cancer.  Prostate cancer.  Skin cancer.  Lung cancer. What should I know about heart disease, diabetes, and high blood pressure? Blood pressure and heart disease  High blood pressure causes heart disease and increases the risk of stroke. This is more likely to develop in people who have high blood pressure readings, are of African descent, or are overweight.  Talk with your health care provider about your target blood pressure readings.  Have your blood pressure checked: ? Every 3-5 years if you are 1-70 years of age. ? Every year if you are 78 years old or older.  If you are between the ages of 82 and 86 and are a current or former smoker, ask your health care provider if you should have a one-time screening for abdominal aortic aneurysm (AAA). Diabetes Have regular diabetes screenings. This checks your fasting blood sugar level. Have the screening done:  Once every three years after age 3 if you are at a normal weight and have a low risk for diabetes.  More often and at a younger age if you are overweight or have a high risk for diabetes. What should I know about preventing infection? Hepatitis B If you have a higher risk for hepatitis  B, you should be screened for this virus. Talk with your health care provider to find out if you are at risk for hepatitis B infection. Hepatitis C Blood testing is recommended for:  Everyone born from [birth year] through 1964.  Anyone with known risk factors for hepatitis C. Sexually transmitted infections (STIs)  You should be screened each year for STIs, including gonorrhea and chlamydia, if: ? You are sexually active and are younger than 63 years of age. ? You are older than 63 years of age and your  health care provider tells you that you are at risk for this type of infection. ? Your sexual activity has changed since you were last screened, and you are at increased risk for chlamydia or gonorrhea. Ask your health care provider if you are at risk.  Ask your health care provider about whether you are at high risk for HIV. Your health care provider may recommend a prescription medicine to help prevent HIV infection. If you choose to take medicine to prevent HIV, you should first get tested for HIV. You should then be tested every 3 months for as long as you are taking the medicine. Follow these instructions at home: Lifestyle  Do not use any products that contain nicotine or tobacco, such as cigarettes, e-cigarettes, and chewing tobacco. If you need help quitting, ask your health care provider.  Do not use street drugs.  Do not share needles.  Ask your health care provider for help if you need support or information about quitting drugs. Alcohol use  Do not drink alcohol if your health care provider tells you not to drink.  If you drink alcohol: ? Limit how much you have to 0-2 drinks a day. ? Be aware of how much alcohol is in your drink. In the [place] one drink equals one 12 oz bottle of beer (355 mL), one 5 oz glass of wine (148 mL), or one 1 oz glass of hard liquor (44 mL). General instructions  Schedule regular health, dental, and eye exams.  Stay current with your vaccines.  Tell your health care provider if: ? You often feel depressed. ? You have ever been abused or do not feel safe at home. Summary  Adopting a healthy lifestyle and getting preventive care are important in promoting health and wellness.  Follow your health care provider's instructions about healthy diet, exercising, and getting tested or screened for diseases.  Follow your health care provider's instructions on monitoring your cholesterol and blood pressure. This information is not intended to replace  advice given to you by your health care provider. Make sure you discuss any questions you have with your health care provider. Document Revised: 12/05/2018 Document Reviewed: 12/05/2018 Elsevier Patient Education  2020 [employer].

## 2020-12-01 NOTE — Assessment & Plan Note (Signed)
New problem Often wakes too early around 4 AM and is not getting good sleep consistently As a result he is fatigued He would like to take Ambien as needed and rarely take it hopefully He had taken this in the past and that was effective and he had no side effects Okay to try Ambien 5 mg nightly as needed

## 2020-12-01 NOTE — Assessment & Plan Note (Signed)
Chronic He did lose weight earlier this year and was exercising regularly until he injured his knee He plans on getting back into regular exercise and will work on weight loss

## 2020-12-02 ENCOUNTER — Other Ambulatory Visit (INDEPENDENT_AMBULATORY_CARE_PROVIDER_SITE_OTHER): Payer: BC Managed Care – PPO

## 2020-12-02 DIAGNOSIS — Z833 Family history of diabetes mellitus: Secondary | ICD-10-CM

## 2020-12-02 DIAGNOSIS — Z0000 Encounter for general adult medical examination without abnormal findings: Secondary | ICD-10-CM

## 2020-12-02 DIAGNOSIS — E785 Hyperlipidemia, unspecified: Secondary | ICD-10-CM | POA: Diagnosis not present

## 2020-12-02 LAB — CBC WITH DIFFERENTIAL/PLATELET
Basophils Absolute: 0 10*3/uL (ref 0.0–0.1)
Basophils Relative: 0.4 % (ref 0.0–3.0)
Eosinophils Absolute: 0.1 10*3/uL (ref 0.0–0.7)
Eosinophils Relative: 1.7 % (ref 0.0–5.0)
HCT: 41.6 % (ref 39.0–52.0)
Hemoglobin: 13.9 g/dL (ref 13.0–17.0)
Lymphocytes Relative: 35.3 % (ref 12.0–46.0)
Lymphs Abs: 1.9 10*3/uL (ref 0.7–4.0)
MCHC: 33.3 g/dL (ref 30.0–36.0)
MCV: 83.6 fl (ref 78.0–100.0)
Monocytes Absolute: 0.5 10*3/uL (ref 0.1–1.0)
Monocytes Relative: 9.5 % (ref 3.0–12.0)
Neutro Abs: 2.8 10*3/uL (ref 1.4–7.7)
Neutrophils Relative %: 53.1 % (ref 43.0–77.0)
Platelets: 235 10*3/uL (ref 150.0–400.0)
RBC: 4.98 Mil/uL (ref 4.22–5.81)
RDW: 14.3 % (ref 11.5–15.5)
WBC: 5.3 10*3/uL (ref 4.0–10.5)

## 2020-12-02 LAB — COMPREHENSIVE METABOLIC PANEL
ALT: 25 U/L (ref 0–53)
AST: 20 U/L (ref 0–37)
Albumin: 4.2 g/dL (ref 3.5–5.2)
Alkaline Phosphatase: 65 U/L (ref 39–117)
BUN: 14 mg/dL (ref 6–23)
CO2: 27 mEq/L (ref 19–32)
Calcium: 9.2 mg/dL (ref 8.4–10.5)
Chloride: 103 mEq/L (ref 96–112)
Creatinine, Ser: 1.03 mg/dL (ref 0.40–1.50)
GFR: 77.43 mL/min (ref >60.00)
Glucose, Bld: 93 mg/dL (ref 70–99)
Potassium: 4.1 mEq/L (ref 3.5–5.1)
Sodium: 138 mEq/L (ref 135–145)
Total Bilirubin: 0.6 mg/dL (ref 0.2–1.2)
Total Protein: 7.3 g/dL (ref 6.0–8.3)

## 2020-12-02 LAB — LIPID PANEL
Cholesterol: 216 mg/dL — ABNORMAL HIGH (ref 0–200)
HDL: 58.5 mg/dL (ref >39.00)
LDL Cholesterol: 142 mg/dL — ABNORMAL HIGH (ref 0–99)
NonHDL: 157.21
Total CHOL/HDL Ratio: 4
Triglycerides: 77 mg/dL (ref 0.0–149.0)
VLDL: 15.4 mg/dL (ref 0.0–40.0)

## 2020-12-02 LAB — HEMOGLOBIN A1C: Hgb A1c MFr Bld: 5.7 % (ref 4.6–6.5)

## 2020-12-02 LAB — TSH: TSH: 2.31 u[IU]/mL (ref 0.35–4.50)

## 2020-12-03 ENCOUNTER — Encounter: Payer: Self-pay | Admitting: Internal Medicine

## 2020-12-03 DIAGNOSIS — R7303 Prediabetes: Secondary | ICD-10-CM | POA: Insufficient documentation

## 2020-12-03 LAB — PSA, TOTAL AND FREE
PSA, % Free: 38 % (calc) (ref >25)
PSA, Free: 0.3 ng/mL
PSA, Total: 0.8 ng/mL (ref <=4.0)

## 2021-02-24 DIAGNOSIS — F4323 Adjustment disorder with mixed anxiety and depressed mood: Secondary | ICD-10-CM | POA: Diagnosis not present

## 2021-06-08 DIAGNOSIS — F4323 Adjustment disorder with mixed anxiety and depressed mood: Secondary | ICD-10-CM | POA: Diagnosis not present

## 2021-07-07 ENCOUNTER — Telehealth: Payer: Self-pay | Admitting: Internal Medicine

## 2021-07-07 MED ORDER — ZOLPIDEM TARTRATE 5 MG PO TABS
5.0000 mg | ORAL_TABLET | Freq: Every evening | ORAL | 1 refills | Status: DC | PRN
Start: 2021-07-07 — End: 2022-04-18

## 2021-07-07 NOTE — Telephone Encounter (Signed)
   Patient calling to request refill for zolpidem (AMBIEN) 5 MG tablet   Pharmacy  [facility] [place], [place] - [address]

## 2021-07-27 ENCOUNTER — Other Ambulatory Visit: Payer: Self-pay | Admitting: Internal Medicine

## 2021-09-23 DIAGNOSIS — F4323 Adjustment disorder with mixed anxiety and depressed mood: Secondary | ICD-10-CM | POA: Diagnosis not present

## 2021-10-25 DIAGNOSIS — F4323 Adjustment disorder with mixed anxiety and depressed mood: Secondary | ICD-10-CM | POA: Diagnosis not present

## 2022-04-18 ENCOUNTER — Other Ambulatory Visit: Payer: Self-pay | Admitting: Internal Medicine

## 2023-03-27 DIAGNOSIS — M542 Cervicalgia: Secondary | ICD-10-CM | POA: Diagnosis not present

## 2023-03-27 DIAGNOSIS — M5030 Other cervical disc degeneration, unspecified cervical region: Secondary | ICD-10-CM | POA: Diagnosis not present

## 2023-03-27 DIAGNOSIS — M25511 Pain in right shoulder: Secondary | ICD-10-CM | POA: Diagnosis not present

## 2023-03-27 DIAGNOSIS — M79645 Pain in left finger(s): Secondary | ICD-10-CM | POA: Diagnosis not present

## 2023-05-04 DIAGNOSIS — M5412 Radiculopathy, cervical region: Secondary | ICD-10-CM | POA: Diagnosis not present

## 2023-05-24 DIAGNOSIS — M5412 Radiculopathy, cervical region: Secondary | ICD-10-CM | POA: Diagnosis not present

## 2023-07-02 ENCOUNTER — Other Ambulatory Visit: Payer: Self-pay | Admitting: Internal Medicine

## 2023-07-17 ENCOUNTER — Telehealth: Payer: Self-pay | Admitting: *Deleted

## 2023-07-17 NOTE — Telephone Encounter (Signed)
I connected with Alex Rubio on 7/22 at 1408 by telephone and verified that I am speaking with the correct person using two identifiers. According to the patient's chart they are due for physical with [facility]. Pt scheduled. There are no transportation issues at this time. Nothing further was needed at the end of our conversation.

## 2023-09-27 ENCOUNTER — Encounter: Payer: BC Managed Care – PPO | Admitting: Internal Medicine

## 2023-11-28 NOTE — Patient Instructions (Addendum)
Blood work was ordered.       Medications changes include :   None    Think about getting a Ct scan of your heart arteries - let me know if you want me to order that - it is $99.  You are due for a cologuard.     Return in about 1 year (around 11/28/2024) for Physical Exam.   Health Maintenance, Male Adopting a healthy lifestyle and getting preventive care are important in promoting health and wellness. Ask your health care provider about: The right schedule for you to have regular tests and exams. Things you can do on your own to prevent diseases and keep yourself healthy. What should I know about diet, weight, and exercise? Eat a healthy diet  Eat a diet that includes plenty of vegetables, fruits, low-fat dairy products, and lean protein. Do not eat a lot of foods that are high in solid fats, added sugars, or sodium. Maintain a healthy weight Body mass index (BMI) is a measurement that can be used to identify possible weight problems. It estimates body fat based on height and weight. Your health care provider can help determine your BMI and help you achieve or maintain a healthy weight. Get regular exercise Get regular exercise. This is one of the most important things you can do for your health. Most adults should: Exercise for at least 150 minutes each week. The exercise should increase your heart rate and make you sweat (moderate-intensity exercise). Do strengthening exercises at least twice a week. This is in addition to the moderate-intensity exercise. Spend less time sitting. Even light physical activity can be beneficial. Watch cholesterol and blood lipids Have your blood tested for lipids and cholesterol at 66 years of age, then have this test every 5 years. You may need to have your cholesterol levels checked more often if: Your lipid or cholesterol levels are high. You are older than 66 years of age. You are at high risk for heart disease. What should I  know about cancer screening? Many types of cancers can be detected early and may often be prevented. Depending on your health history and family history, you may need to have cancer screening at various ages. This may include screening for: Colorectal cancer. Prostate cancer. Skin cancer. Lung cancer. What should I know about heart disease, diabetes, and high blood pressure? Blood pressure and heart disease High blood pressure causes heart disease and increases the risk of stroke. This is more likely to develop in people who have high blood pressure readings or are overweight. Talk with your health care provider about your target blood pressure readings. Have your blood pressure checked: Every 3-5 years if you are 55-35 years of age. Every year if you are 38 years old or older. If you are between the ages of 17 and 43 and are a current or former smoker, ask your health care provider if you should have a one-time screening for abdominal aortic aneurysm (AAA). Diabetes Have regular diabetes screenings. This checks your fasting blood sugar level. Have the screening done: Once every three years after age 18 if you are at a normal weight and have a low risk for diabetes. More often and at a younger age if you are overweight or have a high risk for diabetes. What should I know about preventing infection? Hepatitis B If you have a higher risk for hepatitis B, you should be screened for this virus. Talk with your health care provider  to find out if you are at risk for hepatitis B infection. Hepatitis C Blood testing is recommended for: Everyone born from [birth year] through 1964. Anyone with known risk factors for hepatitis C. Sexually transmitted infections (STIs) You should be screened each year for STIs, including gonorrhea and chlamydia, if: You are sexually active and are younger than 66 years of age. You are older than 66 years of age and your health care provider tells you that you are at risk  for this type of infection. Your sexual activity has changed since you were last screened, and you are at increased risk for chlamydia or gonorrhea. Ask your health care provider if you are at risk. Ask your health care provider about whether you are at high risk for HIV. Your health care provider may recommend a prescription medicine to help prevent HIV infection. If you choose to take medicine to prevent HIV, you should first get tested for HIV. You should then be tested every 3 months for as long as you are taking the medicine. Follow these instructions at home: Alcohol use Do not drink alcohol if your health care provider tells you not to drink. If you drink alcohol: Limit how much you have to 0-2 drinks a day. Know how much alcohol is in your drink. In the [place] one drink equals one 12 oz bottle of beer (355 mL), one 5 oz glass of wine (148 mL), or one 1 oz glass of hard liquor (44 mL). Lifestyle Do not use any products that contain nicotine or tobacco. These products include cigarettes, chewing tobacco, and vaping devices, such as e-cigarettes. If you need help quitting, ask your health care provider. Do not use street drugs. Do not share needles. Ask your health care provider for help if you need support or information about quitting drugs. General instructions Schedule regular health, dental, and eye exams. Stay current with your vaccines. Tell your health care provider if: You often feel depressed. You have ever been abused or do not feel safe at home. Summary Adopting a healthy lifestyle and getting preventive care are important in promoting health and wellness. Follow your health care provider's instructions about healthy diet, exercising, and getting tested or screened for diseases. Follow your health care provider's instructions on monitoring your cholesterol and blood pressure. This information is not intended to replace advice given to you by your health care provider. Make  sure you discuss any questions you have with your health care provider. Document Revised: 05/03/2021 Document Reviewed: 05/03/2021 Elsevier Patient Education  2024 [employer]   Health Maintenance, Male Adopting a healthy lifestyle and getting preventive care are important in promoting health and wellness. Ask your health care provider about: The right schedule for you to have regular tests and exams. Things you can do on your own to prevent diseases and keep yourself healthy. What should I know about diet, weight, and exercise? Eat a healthy diet  Eat a diet that includes plenty of vegetables, fruits, low-fat dairy products, and lean protein. Do not eat a lot of foods that are high in solid fats, added sugars, or sodium. Maintain a healthy weight Body mass index (BMI) is a measurement that can be used to identify possible weight problems. It estimates body fat based on height and weight. Your health care provider can help determine your BMI and help you achieve or maintain a healthy weight. Get regular exercise Get regular exercise. This is one of the most important things you can do for your  health. Most adults should: Exercise for at least 150 minutes each week. The exercise should increase your heart rate and make you sweat (moderate-intensity exercise). Do strengthening exercises at least twice a week. This is in addition to the moderate-intensity exercise. Spend less time sitting. Even light physical activity can be beneficial. Watch cholesterol and blood lipids Have your blood tested for lipids and cholesterol at 66 years of age, then have this test every 5 years. You may need to have your cholesterol levels checked more often if: Your lipid or cholesterol levels are high. You are older than 66 years of age. You are at high risk for heart disease. What should I know about cancer screening? Many types of cancers can be detected early and may often be prevented. Depending on your  health history and family history, you may need to have cancer screening at various ages. This may include screening for: Colorectal cancer. Prostate cancer. Skin cancer. Lung cancer. What should I know about heart disease, diabetes, and high blood pressure? Blood pressure and heart disease High blood pressure causes heart disease and increases the risk of stroke. This is more likely to develop in people who have high blood pressure readings or are overweight. Talk with your health care provider about your target blood pressure readings. Have your blood pressure checked: Every 3-5 years if you are 77-33 years of age. Every year if you are 9 years old or older. If you are between the ages of 84 and 80 and are a current or former smoker, ask your health care provider if you should have a one-time screening for abdominal aortic aneurysm (AAA). Diabetes Have regular diabetes screenings. This checks your fasting blood sugar level. Have the screening done: Once every three years after age 18 if you are at a normal weight and have a low risk for diabetes. More often and at a younger age if you are overweight or have a high risk for diabetes. What should I know about preventing infection? Hepatitis B If you have a higher risk for hepatitis B, you should be screened for this virus. Talk with your health care provider to find out if you are at risk for hepatitis B infection. Hepatitis C Blood testing is recommended for: Everyone born from [birth year] through 1964. Anyone with known risk factors for hepatitis C. Sexually transmitted infections (STIs) You should be screened each year for STIs, including gonorrhea and chlamydia, if: You are sexually active and are younger than 66 years of age. You are older than 66 years of age and your health care provider tells you that you are at risk for this type of infection. Your sexual activity has changed since you were last screened, and you are at increased risk  for chlamydia or gonorrhea. Ask your health care provider if you are at risk. Ask your health care provider about whether you are at high risk for HIV. Your health care provider may recommend a prescription medicine to help prevent HIV infection. If you choose to take medicine to prevent HIV, you should first get tested for HIV. You should then be tested every 3 months for as long as you are taking the medicine. Follow these instructions at home: Alcohol use Do not drink alcohol if your health care provider tells you not to drink. If you drink alcohol: Limit how much you have to 0-2 drinks a day. Know how much alcohol is in your drink. In the [place] one drink equals one 12 oz bottle of  beer (355 mL), one 5 oz glass of wine (148 mL), or one 1 oz glass of hard liquor (44 mL). Lifestyle Do not use any products that contain nicotine or tobacco. These products include cigarettes, chewing tobacco, and vaping devices, such as e-cigarettes. If you need help quitting, ask your health care provider. Do not use street drugs. Do not share needles. Ask your health care provider for help if you need support or information about quitting drugs. General instructions Schedule regular health, dental, and eye exams. Stay current with your vaccines. Tell your health care provider if: You often feel depressed. You have ever been abused or do not feel safe at home. Summary Adopting a healthy lifestyle and getting preventive care are important in promoting health and wellness. Follow your health care provider's instructions about healthy diet, exercising, and getting tested or screened for diseases. Follow your health care provider's instructions on monitoring your cholesterol and blood pressure. This information is not intended to replace advice given to you by your health care provider. Make sure you discuss any questions you have with your health care provider. Document Revised: 05/03/2021 Document Reviewed:  05/03/2021 Elsevier Patient Education  2024 [employer].

## 2023-11-28 NOTE — Progress Notes (Unsigned)
    Subjective:    Patient ID: Alex Rubio, male    DOB: 08-03-1956, 67 y.o.   MRN: [id number]     HPI Alex Rubio is here for a physical exam and his chronic medical problems.   Discuss Ct CAC   Medications and allergies reviewed with patient and updated if appropriate.  Current Outpatient Medications on File Prior to Visit  Medication Sig Dispense Refill   zolpidem (AMBIEN) 5 MG tablet Take 1 tablet (5 mg total) by mouth at bedtime as needed for sleep. 30 tablet 1   No current facility-administered medications on file prior to visit.    Review of Systems     Objective:  There were no vitals filed for this visit. There were no vitals filed for this visit. There is no height or weight on file to calculate BMI.  BP Readings from Last 3 Encounters:  12/01/20 128/70  08/31/18 132/84  11/14/17 122/84    Wt Readings from Last 3 Encounters:  12/01/20 258 lb (117 kg)  08/31/18 243 lb (110.2 kg)  11/14/17 265 lb (120.2 kg)      Physical Exam Constitutional: He appears well-developed and well-nourished. No distress.  HENT:  Head: Normocephalic and atraumatic.  Right Ear: External ear normal.  Left Ear: External ear normal.  Normal ear canals and TM b/l  Mouth/Throat: Oropharynx is clear and moist. Eyes: Conjunctivae and EOM are normal.  Neck: Neck supple. No tracheal deviation present. No thyromegaly present.  No carotid bruit  Cardiovascular: Normal rate, regular rhythm, normal heart sounds and intact distal pulses.   No murmur heard.  No lower extremity edema. Pulmonary/Chest: Effort normal and breath sounds normal. No respiratory distress. He has no wheezes. He has no rales.  Abdominal: Soft. He exhibits no distension. There is no tenderness.  Genitourinary: deferred  Lymphadenopathy:   He has no cervical adenopathy.  Skin: Skin is warm and dry. He is not diaphoretic.  Psychiatric: He has a normal mood and affect. His behavior is normal.         Assessment  & Plan:   Physical exam: Screening blood work  ordered Exercise    Weight   Substance abuse   none   Reviewed recommended immunizations.   Health Maintenance  Topic Date Due   DTaP/Tdap/Td (1 - Tdap) Never done   Fecal DNA (Cologuard)  Never done   Zoster Vaccines- Shingrix (1 of 2) Never done   Pneumonia Vaccine 54+ Years old (1 of 1 - PCV) Never done   INFLUENZA VACCINE  07/27/2023   COVID-19 Vaccine (4 - 2023-24 season) 08/27/2023   Hepatitis C Screening  Completed   HPV VACCINES  Aged Out     See Problem List for Assessment and Plan of chronic medical problems.

## 2023-11-29 ENCOUNTER — Ambulatory Visit (INDEPENDENT_AMBULATORY_CARE_PROVIDER_SITE_OTHER): Payer: PPO | Admitting: Internal Medicine

## 2023-11-29 ENCOUNTER — Encounter: Payer: Self-pay | Admitting: Internal Medicine

## 2023-11-29 VITALS — BP 128/68 | HR 68 | Temp 98.4°F | Ht 74.0 in | Wt 255.0 lb

## 2023-11-29 DIAGNOSIS — Z125 Encounter for screening for malignant neoplasm of prostate: Secondary | ICD-10-CM

## 2023-11-29 DIAGNOSIS — G479 Sleep disorder, unspecified: Secondary | ICD-10-CM

## 2023-11-29 DIAGNOSIS — Z832 Family history of diseases of the blood and blood-forming organs and certain disorders involving the immune mechanism: Secondary | ICD-10-CM | POA: Diagnosis not present

## 2023-11-29 DIAGNOSIS — E66811 Obesity, class 1: Secondary | ICD-10-CM

## 2023-11-29 DIAGNOSIS — Z0000 Encounter for general adult medical examination without abnormal findings: Secondary | ICD-10-CM | POA: Diagnosis not present

## 2023-11-29 DIAGNOSIS — E6609 Other obesity due to excess calories: Secondary | ICD-10-CM | POA: Diagnosis not present

## 2023-11-29 DIAGNOSIS — Z6833 Body mass index (BMI) 33.0-33.9, adult: Secondary | ICD-10-CM | POA: Diagnosis not present

## 2023-11-29 DIAGNOSIS — G4733 Obstructive sleep apnea (adult) (pediatric): Secondary | ICD-10-CM | POA: Diagnosis not present

## 2023-11-29 DIAGNOSIS — R7303 Prediabetes: Secondary | ICD-10-CM | POA: Diagnosis not present

## 2023-11-29 DIAGNOSIS — E785 Hyperlipidemia, unspecified: Secondary | ICD-10-CM

## 2023-11-29 MED ORDER — ZOLPIDEM TARTRATE 5 MG PO TABS: 5.0000 mg | ORAL_TABLET | Freq: Every evening | ORAL | 5 refills | Status: AC | PRN

## 2023-11-29 NOTE — Assessment & Plan Note (Signed)
A couple of his siblings have factor V Leiden deficiency He denies any personal history of blood clots Will check factor V Leiden

## 2023-11-29 NOTE — Assessment & Plan Note (Signed)
Chronic Cholesterol slightly higher than ideal and ASCVD risk is high Will check lipid panel today along with CMP, CBC and TSH He would be a good candidate for a CT coronary artery calcium score test-we will try to arrange this next year sometime when he is in the area-this would be good to get a better idea of what his risk is Continue regular exercise, healthy diet

## 2023-11-29 NOTE — Assessment & Plan Note (Signed)
Chronic Check a1c Low sugar / carb diet Stressed regular exercise  

## 2023-11-29 NOTE — Assessment & Plan Note (Signed)
Chronic Intermittent sleep difficulties Continue Ambien 5 mg nightly as needed-only uses occasionally and typically only after not sleeping well for 3-4 nights Refilled today

## 2023-11-29 NOTE — Assessment & Plan Note (Signed)
Chronic Continue regular exercise Continue healthy diet She will work on weight loss-[place]

## 2023-11-30 LAB — COMPREHENSIVE METABOLIC PANEL
ALT: 16 U/L (ref 0–53)
AST: 16 U/L (ref 0–37)
Albumin: 4.3 g/dL (ref 3.5–5.2)
Alkaline Phosphatase: 74 U/L (ref 39–117)
BUN: 20 mg/dL (ref 6–23)
CO2: 27 meq/L (ref 19–32)
Calcium: 9.6 mg/dL (ref 8.4–10.5)
Chloride: 103 meq/L (ref 96–112)
Creatinine, Ser: 1.06 mg/dL (ref 0.40–1.50)
GFR: 73.25 mL/min (ref >60.00)
Glucose, Bld: 90 mg/dL (ref 70–99)
Potassium: 4.6 meq/L (ref 3.5–5.1)
Sodium: 138 meq/L (ref 135–145)
Total Bilirubin: 0.7 mg/dL (ref 0.2–1.2)
Total Protein: 7.1 g/dL (ref 6.0–8.3)

## 2023-11-30 LAB — CBC WITH DIFFERENTIAL/PLATELET
Basophils Absolute: 0 10*3/uL (ref 0.0–0.1)
Basophils Relative: 0.7 % (ref 0.0–3.0)
Eosinophils Absolute: 0.2 10*3/uL (ref 0.0–0.7)
Eosinophils Relative: 2.1 % (ref 0.0–5.0)
HCT: 42.1 % (ref 39.0–52.0)
Hemoglobin: 14.1 g/dL (ref 13.0–17.0)
Lymphocytes Relative: 27.7 % (ref 12.0–46.0)
Lymphs Abs: 2.1 10*3/uL (ref 0.7–4.0)
MCHC: 33.4 g/dL (ref 30.0–36.0)
MCV: 85.4 fL (ref 78.0–100.0)
Monocytes Absolute: 0.6 10*3/uL (ref 0.1–1.0)
Monocytes Relative: 8.4 % (ref 3.0–12.0)
Neutro Abs: 4.6 10*3/uL (ref 1.4–7.7)
Neutrophils Relative %: 61.1 % (ref 43.0–77.0)
Platelets: 252 10*3/uL (ref 150.0–400.0)
RBC: 4.94 Mil/uL (ref 4.22–5.81)
RDW: 14.3 % (ref 11.5–15.5)
WBC: 7.5 10*3/uL (ref 4.0–10.5)

## 2023-11-30 LAB — TSH: TSH: 1.33 u[IU]/mL (ref 0.35–5.50)

## 2023-11-30 LAB — LIPID PANEL
Cholesterol: 246 mg/dL — ABNORMAL HIGH (ref 0–200)
HDL: 51.1 mg/dL (ref >39.00)
LDL Cholesterol: 166 mg/dL — ABNORMAL HIGH (ref 0–99)
NonHDL: 195.13
Total CHOL/HDL Ratio: 5
Triglycerides: 147 mg/dL (ref 0.0–149.0)
VLDL: 29.4 mg/dL (ref 0.0–40.0)

## 2023-11-30 LAB — HEMOGLOBIN A1C: Hgb A1c MFr Bld: 5.9 % (ref 4.6–6.5)

## 2023-11-30 LAB — PSA, MEDICARE: PSA: 0.99 ng/mL (ref 0.10–4.00)

## 2023-12-05 LAB — FACTOR 5 LEIDEN: Result: NEGATIVE

## 2024-01-19 ENCOUNTER — Ambulatory Visit (INDEPENDENT_AMBULATORY_CARE_PROVIDER_SITE_OTHER): Payer: Medicare Other

## 2024-01-19 VITALS — Ht 74.0 in | Wt 252.0 lb

## 2024-01-19 DIAGNOSIS — Z0000 Encounter for general adult medical examination without abnormal findings: Secondary | ICD-10-CM | POA: Diagnosis not present

## 2024-01-19 NOTE — Progress Notes (Signed)
Subjective:   Alex Rubio is a 68 y.o. male who presents for an Initial Medicare Annual Wellness Visit.  Visit Complete: Virtual I connected with  Alex Rubio on 01/19/24 by a audio enabled telemedicine application and verified that I am speaking with the correct person using two identifiers.  Patient Location: Home  Provider Location: Office/Clinic  I discussed the limitations of evaluation and management by telemedicine. The patient expressed understanding and agreed to proceed.  Vital Signs: Because this visit was a virtual/telehealth visit, some criteria may be missing or patient reported. Any vitals not documented were not able to be obtained and vitals that have been documented are patient reported.  Patient Medicare AWV questionnaire was completed by the patient on 01/19/2024; I have confirmed that all information answered by patient is correct and no changes since this date.  Cardiac Risk Factors include: advanced age (>59men, >19 women);male gender;dyslipidemia;Other (see comment), Risk factor comments: OSA     Objective:    Today's Vitals   01/19/24 0808  Weight: 252 lb (114.3 kg)  Height: 6\' 2"  (1.88 m)   Body mass index is 32.35 kg/m.     01/19/2024    8:19 AM  Advanced Directives  Does Patient Have a Medical Advance Directive? Yes  Type of Estate agent of [place];Living will  Copy of Healthcare Power of Attorney in Chart? No - copy requested    Current Medications (verified) Outpatient Encounter Medications as of 01/19/2024  Medication Sig   zolpidem (AMBIEN) 5 MG tablet Take 1 tablet (5 mg total) by mouth at bedtime as needed for sleep.   No facility-administered encounter medications on file as of 01/19/2024.    Allergies (verified) Patient has no known allergies.   History: Past Medical History:  Diagnosis Date   OSA (obstructive sleep apnea)    intol of CPAP trial (remote)   Past Surgical History:  Procedure Laterality  Date   SHOULDER SURGERY  1977   Family History  Problem Relation Age of Onset   Leukemia Mother 41   Diabetes Mellitus II Father    Social History   Socioeconomic History   Marital status: Married    Spouse name: Darl Pikes   Number of children: 1   Years of education: Not on file   Highest education level: Master's degree (e.g., MA, MS, MEng, MEd, MSW, MBA)  Occupational History   Occupation: RETIRED  Tobacco Use   Smoking status: Never   Smokeless tobacco: Never   Tobacco comments:    married, 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[employer])  Vaping Use   Vaping status: Never Used  Substance and Sexual Activity   Alcohol use: No   Drug use: No   Sexual activity: Not on file  Other Topics Concern   Not on file  Social History Narrative   Exercise: walking regularly   Lives with wife and one dog-2025   Social Drivers of Health   Financial Resource Strain: Low Risk  (01/19/2024)   Overall Financial Resource Strain (CARDIA)    Difficulty of Paying Living Expenses: Not hard at all  Food Insecurity: No Food Insecurity (01/19/2024)   Hunger Vital Sign    Worried About Running Out of Food in the Last Year: Never true    Ran Out of Food in the Last Year: Never true  Transportation Needs: No Transportation Needs (01/19/2024)   PRAPARE - Administrator, Civil Service (Medical): No    Lack of Transportation (Non-Medical): No  Physical  Activity: Sufficiently Active (01/19/2024)   Exercise Vital Sign    Days of Exercise per Week: 5 days    Minutes of Exercise per Session: 50 min  Stress: No Stress Concern Present (01/19/2024)   [employer] of Occupational Health - Occupational Stress Questionnaire    Feeling of Stress : Not at all  Social Connections: Socially Integrated (01/19/2024)   Social Connection and Isolation Panel [NHANES]    Frequency of Communication with Friends and Family: Three times a week    Frequency of Social Gatherings with Friends and Family: Once a week     Attends Religious Services: More than 4 times per year    Active Member of [employer] or Organizations: Yes    Attends Engineer, structural: More than 4 times per year    Marital Status: Married    Tobacco Counseling Counseling given: Not Answered Tobacco comments: married, 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 0  Vision? 0  Difficulty concentrating or making decisions? 0  Walking or climbing stairs? 0  Dressing or bathing? 0  Doing errands, shopping? 0  Preparing Food and eating ? N  Using the Toilet? N  In the past six months, have you accidently leaked urine? N  Do you have problems with loss of bowel control? N  Managing your Medications? N  Managing your Finances? N  Housekeeping or managing your Housekeeping? N    Patient Care Team: Pincus Sanes, MD as PCP - General (Internal Medicine)  Indicate any recent Medical Services you may have received from other than [facility] providers in the past year (date may be approximate).     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  Hearing/Vision screen Hearing Screening - Comments:: Denies hearing difficulties   Vision Screening - Comments:: Denies vision issues.    Goals Addressed               This Visit's Progress     Patient Stated (pt-stated)        Lose about 20 lbs      Depression Screen    01/19/2024    8:19 AM 12/01/2020     1:36 PM 08/31/2018    8:17 AM 08/31/2018    8:16 AM 08/26/2016   10:11 AM  PHQ 2/9 Scores  PHQ - 2 Score 3 0 0 0 0  PHQ- 9 Score 3  0  0    Fall Risk    01/19/2024    7:42 AM 08/31/2018    8:16 AM  Fall Risk   Falls in the past year? 0 No  Number falls in past yr: 0   Injury with Fall? 0   Follow up Falls evaluation completed;Falls prevention discussed     MEDICARE RISK AT HOME: Medicare Risk at Home Any stairs in or around the home?: (Patient-Rptd) Yes If so, are there  any without handrails?: (Patient-Rptd) No Home free of loose throw rugs in walkways, pet beds, electrical cords, etc?: (Patient-Rptd) Yes Adequate lighting in your home to reduce risk of falls?: (Patient-Rptd) Yes Life alert?: (Patient-Rptd) No Use of a cane, walker or w/c?: (Patient-Rptd) No Grab bars in the bathroom?: (Patient-Rptd) No Shower chair or bench in shower?: (Patient-Rptd) No Elevated toilet seat or a handicapped toilet?: (Patient-Rptd) No  TIMED UP AND GO:  Was the test performed? No    Cognitive Function:        01/19/2024    8:11 AM  6CIT Screen  What Year? 0 points  What month? 0 points  What time? 0 points  Count back from 20 0 points  Months in reverse 0 points  Repeat phrase 0 points  Total Score 0 points    Immunizations Immunization History  Administered Date(s) Administered   Fluad Quad(high Dose 65+) 10/15/2019   Moderna Sars-Covid-2 Vaccination 11/16/2020   PFIZER(Purple Top)SARS-COV-2 Vaccination 03/08/2020, 03/28/2020    TDAP status: Due, Education has been provided regarding the importance of this vaccine. Advised may receive this vaccine at local pharmacy or Health Dept. Aware to provide a copy of the vaccination record if obtained from local pharmacy or Health Dept. Verbalized acceptance and understanding.  Flu Vaccine status: Declined, Education has been provided regarding the importance of this vaccine but patient still declined. Advised may receive this vaccine  at local pharmacy or Health Dept. Aware to provide a copy of the vaccination record if obtained from local pharmacy or Health Dept. Verbalized acceptance and understanding.  Pneumococcal vaccine status: Declined,  Education has been provided regarding the importance of this vaccine but patient still declined. Advised may receive this vaccine at local pharmacy or Health Dept. Aware to provide a copy of the vaccination record if obtained from local pharmacy or Health Dept. Verbalized acceptance and understanding.   Covid-19 vaccine status: Declined, Education has been provided regarding the importance of this vaccine but patient still declined. Advised may receive this vaccine at local pharmacy or Health Dept.or vaccine clinic. Aware to provide a copy of the vaccination record if obtained from local pharmacy or Health Dept. Verbalized acceptance and understanding.  Qualifies for Shingles Vaccine? Yes   Zostavax completed No   Shingrix Completed?: No.    Education has been provided regarding the importance of this vaccine. Patient has been advised to call insurance company to determine out of pocket expense if they have not yet received this vaccine. Advised may also receive vaccine at local pharmacy or Health Dept. Verbalized acceptance and understanding.  Screening Tests Health Maintenance  Topic Date Due   Fecal DNA (Cologuard)  Never done   COVID-19 Vaccine (4 - 2024-25 season) 08/27/2023   Zoster Vaccines- Shingrix (1 of 2) 02/27/2024 (Originally 08/22/2007)   INFLUENZA VACCINE  03/25/2024 (Originally 07/27/2023)   DTaP/Tdap/Td (1 - Tdap) 11/28/2024 (Originally [birth year]/21/1976)   Pneumonia Vaccine 57+ Years old (1 of 1 - PCV) 11/28/2024 (Originally 08/21/2022)   Medicare Annual Wellness (AWV)  01/18/2025   Hepatitis C Screening  Completed   HPV VACCINES  Aged Out    Health Maintenance  Health Maintenance Due  Topic Date Due   Fecal DNA (Cologuard)  Never done   COVID-19 Vaccine (4 - 2024-25  season) 08/27/2023    Lung Cancer Screening: (Low Dose CT Chest recommended if Age 84-80 years, 20 pack-year currently smoking OR have quit w/in 15years.) does not qualify.   Lung Cancer Screening Referral: N/A  Additional Screening:  Hepatitis C Screening: does qualify; Completed 09/03/2018  Vision Screening: Recommended annual ophthalmology exams for early detection of glaucoma and other disorders of the eye. Is the patient up to date with their annual eye exam?  Yes  Who is the provider or what is the name of the office in which the patient attends annual eye exams? Dr. Truett Perna ([place]) If pt is not established with a provider, would they like to be referred to a provider to establish care? No .   Dental Screening: Recommended annual dental exams for proper oral hygiene   Community Resource Referral / Chronic Care Management: CRR required this visit?  No   CCM required this visit?  No    Plan:     I have personally reviewed and noted the following in the patient's chart:   Medical and social history Use of alcohol, tobacco or illicit drugs  Current medications and supplements including opioid prescriptions. Patient is not currently taking opioid prescriptions. Functional ability and status Nutritional status Physical activity Advanced directives List of other physicians Hospitalizations, surgeries, and ER visits in previous 12 months Vitals Screenings to include cognitive, depression, and falls Referrals and appointments  In addition, I have reviewed and discussed with patient certain preventive protocols, quality metrics, and best practice recommendations. A written personalized care plan for preventive services as well as general preventive health recommendations were provided to patient.     Sydelle Sherfield L Lisabeth Mian, CMA   01/19/2024   After Visit Summary: (MyChart) Due to this being a telephonic visit, the after visit summary with patients personalized plan was offered  to patient via MyChart   Nurse Notes: Patient is due for a Shingrix vaccine.  He declines all other vaccines for now.  Patient is also due for a cologuard.  He had no other concerns to address today.  Patient did not schedule for next year's AWV.

## 2024-01-19 NOTE — Patient Instructions (Signed)
Alex Rubio , Thank you for taking time to come for your Medicare Wellness Visit. I appreciate your ongoing commitment to your health goals. Please review the following plan we discussed and let me know if I can assist you in the future.   Referrals/Orders/Follow-Ups/Clinician Recommendations: It was nice talking to you today.  You are due for a colonoscopy or Cologuard and a Shingles vaccine.  Keep up the good work.  This is a list of the screening recommended for you and due dates:  Health Maintenance  Topic Date Due   Cologuard (Stool DNA test)  Never done   COVID-19 Vaccine (4 - 2024-25 season) 08/27/2023   Zoster (Shingles) Vaccine (1 of 2) 02/27/2024*   Flu Shot  03/25/2024*   DTaP/Tdap/Td vaccine (1 - Tdap) 11/28/2024*   Pneumonia Vaccine (1 of 1 - PCV) 11/28/2024*   Medicare Annual Wellness Visit  01/18/2025   Hepatitis C Screening  Completed   HPV Vaccine  Aged Out  *Topic was postponed. The date shown is not the original due date.    Advanced directives: (Copy Requested) Please bring a copy of your health care power of attorney and living will to the office to be added to your chart at your convenience.  Next Medicare Annual Wellness Visit scheduled for next year: No

## 2024-06-14 LAB — COLOGUARD: COLOGUARD: POSITIVE — AB

## 2024-11-27 ENCOUNTER — Telehealth: Payer: Self-pay | Admitting: Internal Medicine

## 2024-11-27 NOTE — Telephone Encounter (Signed)
 Contacted Alex Rubio to schedule their annual wellness visit. Patient declined to schedule AWV at this time. Patient has moved to [place] over one year a go.  [facility] Care Guide [facility] AWV TEAM Direct Dial: [phone]

## 2024-12-05 NOTE — Telephone Encounter (Signed)
 PCP removed.
